# Patient Record
Sex: Male | Born: 1955 | Race: White | Hispanic: No | Marital: Married | Smoking: Current every day smoker
Health system: Southern US, Community
[De-identification: ages and names within clinical notes are randomized; demographics above are authoritative.]

## PROBLEM LIST (undated history)

## (undated) DIAGNOSIS — F32A Depression, unspecified: Secondary | ICD-10-CM

## (undated) DIAGNOSIS — D649 Anemia, unspecified: Secondary | ICD-10-CM

## (undated) DIAGNOSIS — F329 Major depressive disorder, single episode, unspecified: Secondary | ICD-10-CM

## (undated) DIAGNOSIS — F419 Anxiety disorder, unspecified: Secondary | ICD-10-CM

## (undated) DIAGNOSIS — I10 Essential (primary) hypertension: Secondary | ICD-10-CM

## (undated) DIAGNOSIS — G4730 Sleep apnea, unspecified: Secondary | ICD-10-CM

## (undated) DIAGNOSIS — R519 Headache, unspecified: Secondary | ICD-10-CM

## (undated) DIAGNOSIS — M1990 Unspecified osteoarthritis, unspecified site: Secondary | ICD-10-CM

## (undated) DIAGNOSIS — G4700 Insomnia, unspecified: Secondary | ICD-10-CM

## (undated) DIAGNOSIS — R51 Headache: Secondary | ICD-10-CM

## (undated) DIAGNOSIS — Z9289 Personal history of other medical treatment: Secondary | ICD-10-CM

## (undated) DIAGNOSIS — T7840XA Allergy, unspecified, initial encounter: Secondary | ICD-10-CM

## (undated) DIAGNOSIS — R9431 Abnormal electrocardiogram [ECG] [EKG]: Secondary | ICD-10-CM

## (undated) DIAGNOSIS — R42 Dizziness and giddiness: Secondary | ICD-10-CM

## (undated) HISTORY — PX: HERNIA REPAIR: SHX51

## (undated) HISTORY — PX: KNEE ARTHROSCOPY: SUR90

## (undated) HISTORY — PX: KNEE SURGERY: SHX244

## (undated) HISTORY — PX: COLONOSCOPY: SHX174

---

## 2005-10-24 ENCOUNTER — Emergency Department: Payer: Self-pay | Admitting: General Practice

## 2008-01-14 ENCOUNTER — Emergency Department: Payer: Self-pay | Admitting: Emergency Medicine

## 2009-10-08 IMAGING — CR DG LUMBAR SPINE 2-3V
1 series · 3 of 3 positions shown · non-contrast
Comparison: none

REASON FOR EXAM: Pain, lower back
COMMENTS:

[Series 1: view not recorded · 0.17mm/px · 3 of 3 slices shown]
[im 1/3]
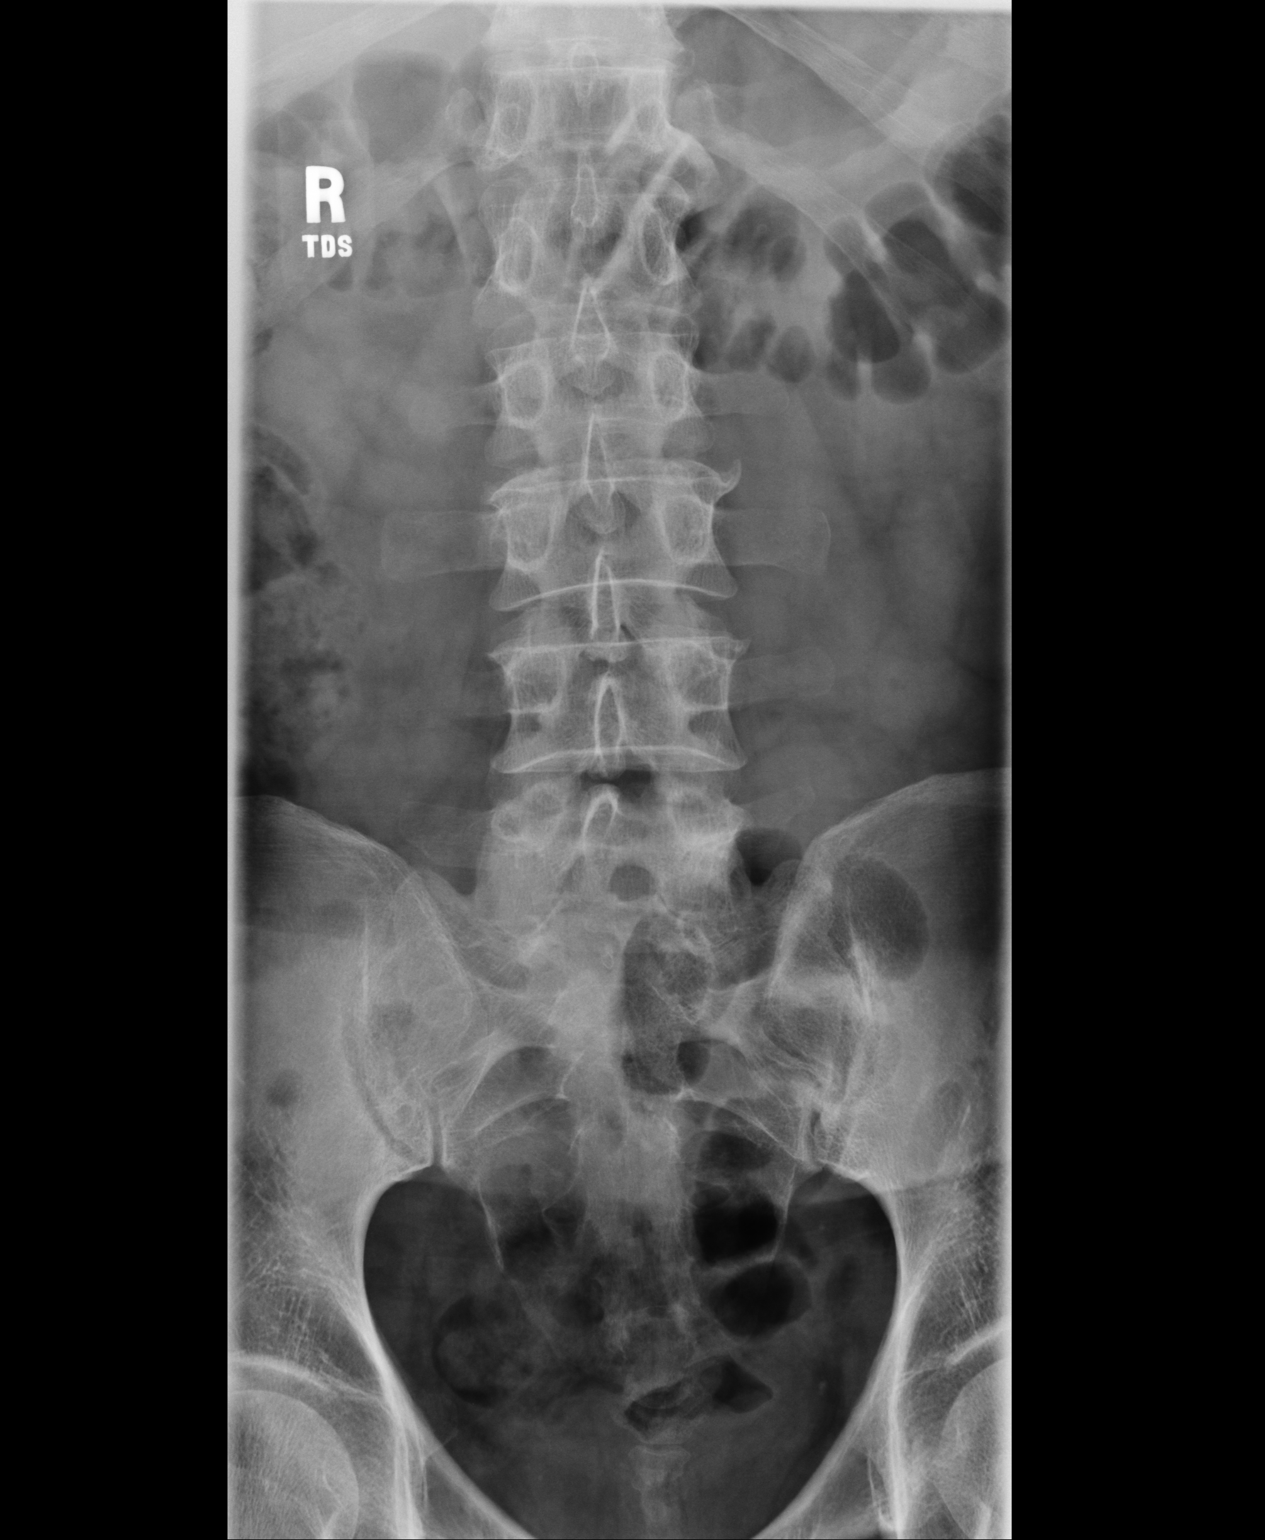
[im 2/3]
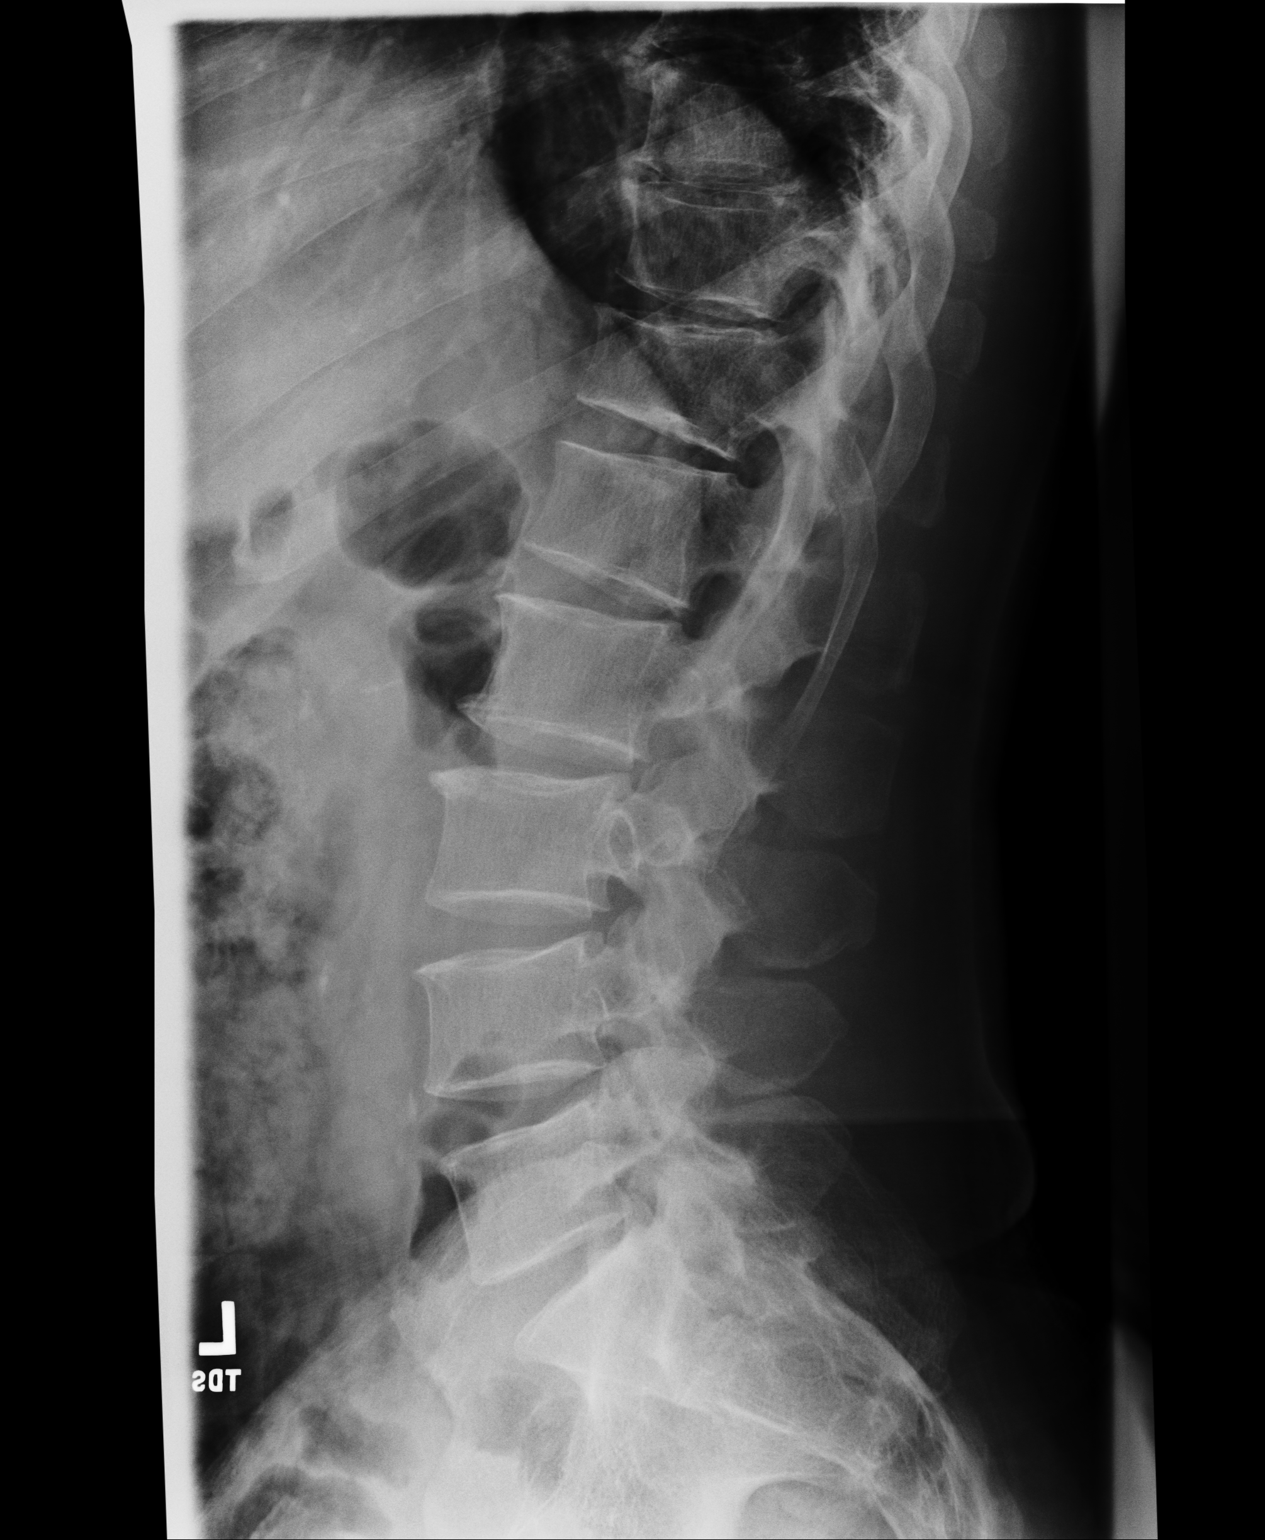
[im 3/3]
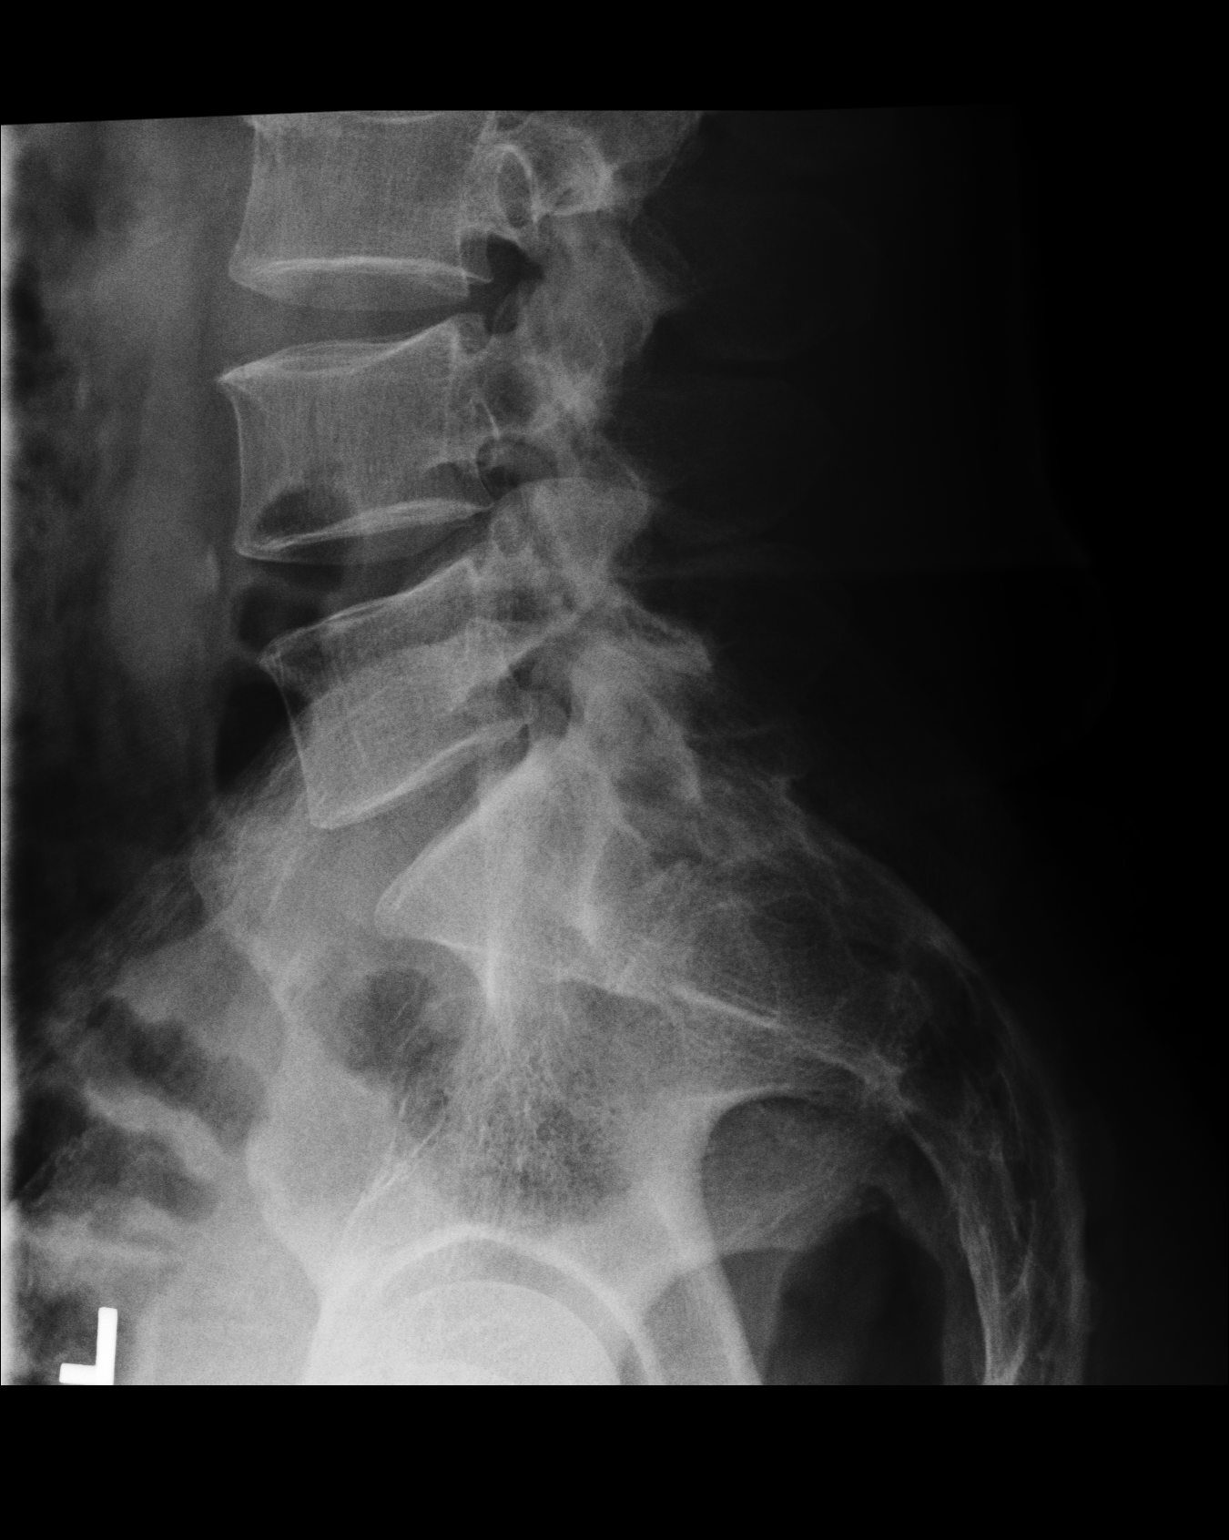

[3 of 3 positions shown; findings below may reference images not displayed]

PROCEDURE:     DXR - DXR LUMBAR SPINE AP AND LATERAL  - January 14, 2008  [DATE]

RESULT:     There is slight old appearing anterior compression deformity of
T12 and L1. No acute vertebral body fracture is seen. The vertebral body
alignment is normal. There is degenerative spurring anteriorly at multiple
levels of the lumbar spine. The pedicles are bilaterally intact.
IMPRESSION: 1.  There is slight anterior vertebral compression deformity of T12 and L1.
These appear to be old.
2.  There is slight degenerative spurring noted anteriorly at multiple
levels.

## 2011-07-13 DIAGNOSIS — F419 Anxiety disorder, unspecified: Secondary | ICD-10-CM | POA: Insufficient documentation

## 2012-02-08 DIAGNOSIS — I2510 Atherosclerotic heart disease of native coronary artery without angina pectoris: Secondary | ICD-10-CM | POA: Insufficient documentation

## 2012-02-08 DIAGNOSIS — R9431 Abnormal electrocardiogram [ECG] [EKG]: Secondary | ICD-10-CM

## 2012-02-08 HISTORY — DX: Abnormal electrocardiogram (ECG) (EKG): R94.31

## 2012-03-02 DIAGNOSIS — R079 Chest pain, unspecified: Secondary | ICD-10-CM | POA: Insufficient documentation

## 2013-01-08 DIAGNOSIS — R5383 Other fatigue: Secondary | ICD-10-CM | POA: Insufficient documentation

## 2013-06-25 ENCOUNTER — Ambulatory Visit: Payer: Self-pay | Admitting: Gastroenterology

## 2014-03-25 ENCOUNTER — Ambulatory Visit: Payer: Self-pay

## 2018-03-17 ENCOUNTER — Encounter: Payer: Self-pay | Admitting: *Deleted

## 2018-03-20 ENCOUNTER — Encounter
Admission: RE | Disposition: A | Discharge status: Home / Self Care | Payer: Self-pay | Source: Home / Self Care | Attending: Gastroenterology

## 2018-03-20 ENCOUNTER — Other Ambulatory Visit: Payer: Self-pay

## 2018-03-20 ENCOUNTER — Ambulatory Visit: Payer: 59 | Admitting: Anesthesiology

## 2018-03-20 ENCOUNTER — Ambulatory Visit
Admission: RE | Admit: 2018-03-20 | Discharge: 2018-03-20 | Disposition: A | Discharge status: Home / Self Care | Payer: 59 | Attending: Gastroenterology | Admitting: Gastroenterology

## 2018-03-20 DIAGNOSIS — K5730 Diverticulosis of large intestine without perforation or abscess without bleeding: Secondary | ICD-10-CM | POA: Insufficient documentation

## 2018-03-20 DIAGNOSIS — R197 Diarrhea, unspecified: Secondary | ICD-10-CM | POA: Diagnosis not present

## 2018-03-20 DIAGNOSIS — I10 Essential (primary) hypertension: Secondary | ICD-10-CM | POA: Diagnosis not present

## 2018-03-20 DIAGNOSIS — Z79899 Other long term (current) drug therapy: Secondary | ICD-10-CM | POA: Insufficient documentation

## 2018-03-20 DIAGNOSIS — F329 Major depressive disorder, single episode, unspecified: Secondary | ICD-10-CM | POA: Diagnosis not present

## 2018-03-20 DIAGNOSIS — Z7951 Long term (current) use of inhaled steroids: Secondary | ICD-10-CM | POA: Insufficient documentation

## 2018-03-20 DIAGNOSIS — R194 Change in bowel habit: Secondary | ICD-10-CM | POA: Insufficient documentation

## 2018-03-20 DIAGNOSIS — Z791 Long term (current) use of non-steroidal anti-inflammatories (NSAID): Secondary | ICD-10-CM | POA: Diagnosis not present

## 2018-03-20 DIAGNOSIS — F419 Anxiety disorder, unspecified: Secondary | ICD-10-CM | POA: Diagnosis not present

## 2018-03-20 HISTORY — PX: COLONOSCOPY WITH PROPOFOL: SHX5780

## 2018-03-20 HISTORY — DX: Headache, unspecified: R51.9

## 2018-03-20 HISTORY — DX: Unspecified osteoarthritis, unspecified site: M19.90

## 2018-03-20 HISTORY — DX: Insomnia, unspecified: G47.00

## 2018-03-20 HISTORY — DX: Sleep apnea, unspecified: G47.30

## 2018-03-20 HISTORY — DX: Anxiety disorder, unspecified: F41.9

## 2018-03-20 HISTORY — DX: Dizziness and giddiness: R42

## 2018-03-20 HISTORY — DX: Depression, unspecified: F32.A

## 2018-03-20 HISTORY — DX: Essential (primary) hypertension: I10

## 2018-03-20 HISTORY — DX: Abnormal electrocardiogram (ECG) (EKG): R94.31

## 2018-03-20 HISTORY — DX: Personal history of other medical treatment: Z92.89

## 2018-03-20 HISTORY — DX: Allergy, unspecified, initial encounter: T78.40XA

## 2018-03-20 HISTORY — DX: Headache: R51

## 2018-03-20 HISTORY — DX: Major depressive disorder, single episode, unspecified: F32.9

## 2018-03-20 HISTORY — DX: Anemia, unspecified: D64.9

## 2018-03-20 SURGERY — COLONOSCOPY WITH PROPOFOL
Anesthesia: General

## 2018-03-20 MED ORDER — SODIUM CHLORIDE 0.9 % IV SOLN
INTRAVENOUS | Status: DC
  Administered 2018-03-20: 11:00:00 via INTRAVENOUS

## 2018-03-20 MED ORDER — LIDOCAINE HCL (CARDIAC) PF 100 MG/5ML IV SOSY
PREFILLED_SYRINGE | INTRAVENOUS | Status: DC | PRN
  Administered 2018-03-20: 800 mg via INTRAVENOUS

## 2018-03-20 MED ORDER — PROPOFOL 10 MG/ML IV BOLUS
INTRAVENOUS | Status: AC
  Filled 2018-03-20: qty 20

## 2018-03-20 MED ORDER — PROPOFOL 10 MG/ML IV BOLUS
INTRAVENOUS | Status: AC
  Filled 2018-03-20: qty 40

## 2018-03-20 MED ORDER — PROPOFOL 500 MG/50ML IV EMUL
INTRAVENOUS | Status: DC | PRN
  Administered 2018-03-20: 100 ug/kg/min via INTRAVENOUS

## 2018-03-20 MED ORDER — PROPOFOL 10 MG/ML IV BOLUS
INTRAVENOUS | Status: DC | PRN
  Administered 2018-03-20: 70 mg via INTRAVENOUS

## 2018-03-20 NOTE — Anesthesia Postprocedure Evaluation (Signed)
Anesthesia Post Note  Patient: Daniel Odonnell  Procedure(s) Performed: COLONOSCOPY WITH PROPOFOL (N/A )  Patient location during evaluation: Endoscopy Anesthesia Type: General Level of consciousness: awake and alert and oriented Pain management: pain level controlled Vital Signs Assessment: post-procedure vital signs reviewed and stable Respiratory status: spontaneous breathing, nonlabored ventilation and respiratory function stable Cardiovascular status: blood pressure returned to baseline and stable Postop Assessment: no signs of nausea or vomiting Anesthetic complications: no     Last Vitals:  Vitals:   03/20/18 1250 03/20/18 1300  BP: 127/87 128/80  Pulse: 61 (!) 56  Resp: 14 16  Temp:    SpO2: 100% 100%    Last Pain:  Vitals:   03/20/18 1300  TempSrc:   PainSc: 0-No pain                 Chontel Warning

## 2018-03-20 NOTE — Op Note (Signed)
[facility] Gastroenterology Patient Name: Daniel StablerDavid Hilario Procedure Date: 03/20/2018 11:59 AM MRN: [id number] Account #: [id number] Date of Birth: 12-23-1955 Admit Type: Outpatient Age: [age] [facility]OM 3 Gender: Male Note Status: Finalized Procedure:            Colonoscopy Indications:          Change in bowel habits, Diarrhea Providers:            Christena DeemMartin U. Timothy Trudell, MD Referring MD:         Geraldo Pitteravid Klein, MD (Referring MD) Medicines:            Monitored Anesthesia Care Complications:        No immediate complications. Procedure:            Pre-Anesthesia Assessment:                       - ASA Grade Assessment: III - A patient with severe                        systemic disease.                       After obtaining informed consent, the colonoscope was                        passed under direct vision. Throughout the procedure,                        the patient's blood pressure, pulse, and oxygen                        saturations were monitored continuously. The                        Colonoscope was introduced through the anus and                        advanced to the the cecum, identified by appendiceal                        orifice and ileocecal valve. The colonoscopy was                        performed with moderate difficulty due to poor bowel                        prep. Successful completion of the procedure was aided                        by lavage. The patient tolerated the procedure well.                        The quality of the bowel preparation was fair. Findings:      Multiple small to medium diverticula were found in the sigmoid colon,       descending colon, transverse colon and distal transverse colon.      The exam was otherwise normal throughout the examined colon.      The retroflexed view of the distal rectum and anal verge was normal and       showed no anal or rectal  abnormalities.      Biopsies for histology were taken with a  cold forceps from the right       colon and left colon for evaluation of microscopic colitis.      The IC valve was turned open and noted to be normal, the TI was not       fully intubated. Impression:           - Preparation of the colon was fair.                       - Diverticulosis in the sigmoid colon, in the                        descending colon, in the transverse colon and in the                        distal transverse colon.                       - The distal rectum and anal verge are normal on                        retroflexion view.                       - Biopsies were taken with a cold forceps from the                        right colon and left colon for evaluation of                        microscopic colitis. Recommendation:       - Discharge patient to home.                       - Await pathology results.                       - Return to GI clinic in 2 weeks. Procedure Code(s):    --- Professional ---                       [phone] [id number], Colonoscopy, flexible; with biopsy, single or                        multiple Diagnosis Code(s):    --- Professional ---                       R19.4, Change in bowel habit                       R19.7, Diarrhea, unspecified                       K57.30, Diverticulosis of large intestine without                        perforation or abscess without bleeding CPT copyright 2018 [employer]. All rights reserved. The codes documented in this report are preliminary and upon coder review may  be revised to meet current compliance requirements. Christena DeemMartin U Devarion Mcclanahan, MD 03/20/2018 12:42:48 PM This report has been signed  electronically. Number of Addenda: 0 Note Initiated On: 03/20/2018 11:59 AM Scope Withdrawal Time: 0 hours 19 minutes 4 seconds  Total Procedure Duration: 0 hours 32 minutes 20 seconds       [facility]

## 2018-03-20 NOTE — Anesthesia Preprocedure Evaluation (Signed)
Anesthesia Evaluation  Patient identified by MRN, date of birth, ID band Patient awake    Reviewed: Allergy & Precautions, H&P , NPO status , Patient's Chart, lab work & pertinent test results, reviewed documented beta blocker date and time   History of Anesthesia Complications Negative for: history of anesthetic complications  Airway Mallampati: I  TM Distance: >3 FB Neck ROM: full    Dental  (+) Dental Advidsory Given, Missing, Teeth Intact   Pulmonary neg shortness of breath, sleep apnea and Continuous Positive Airway Pressure Ventilation , neg COPD, neg recent URI, Current Smoker,           Cardiovascular Exercise Tolerance: Good hypertension, (-) angina(-) CAD, (-) Past MI, (-) Cardiac Stents and (-) CABG negative cardio ROS  (-) dysrhythmias (-) Valvular Problems/Murmurs     Neuro/Psych PSYCHIATRIC DISORDERS Anxiety Depression negative neurological ROS     GI/Hepatic negative GI ROS, Neg liver ROS,   Endo/Other  negative endocrine ROS  Renal/GU negative Renal ROS  negative genitourinary   Musculoskeletal   Abdominal   Peds  Hematology  (+) Blood dyscrasia, anemia ,   Anesthesia Other Findings Past Medical History: 02/08/2012: Abnormal ECG No date: Abnormal EKG No date: Allergy No date: Anxiety No date: Arthritis No date: Depression No date: Dizziness No date: Headache No date: History of myocardial perfusion scan No date: Hypertension No date: Insomnia No date: Normocytic anemia No date: Sleep apnea   Reproductive/Obstetrics negative OB ROS                             Anesthesia Physical Anesthesia Plan  ASA: II  Anesthesia Plan: General   Post-op Pain Management:    Induction: Intravenous  PONV Risk Score and Plan: 1 and Propofol infusion and TIVA  Airway Management Planned: Natural Airway and Nasal Cannula  Additional Equipment:   Intra-op Plan:    Post-operative Plan:   Informed Consent: I have reviewed the patients History and Physical, chart, labs and discussed the procedure including the risks, benefits and alternatives for the proposed anesthesia with the patient or authorized representative who has indicated his/her understanding and acceptance.     Dental Advisory Given  Plan Discussed with: Anesthesiologist, CRNA and Surgeon  Anesthesia Plan Comments:         Anesthesia Quick Evaluation

## 2018-03-20 NOTE — Anesthesia Post-op Follow-up Note (Signed)
Anesthesia QCDR form completed.        

## 2018-03-20 NOTE — Transfer of Care (Signed)
Immediate Anesthesia Transfer of Care Note  Patient: Daniel SitterDavid Clay Laforte  Procedure(s) Performed: COLONOSCOPY WITH PROPOFOL (N/A )  Patient Location: PACU  Anesthesia Type:General  Level of Consciousness: awake and alert   Airway & Oxygen Therapy: Patient Spontanous Breathing and Patient connected to nasal cannula oxygen  Post-op Assessment: Report given to RN and Post -op Vital signs reviewed and stable  Post vital signs: Reviewed and stable  Last Vitals:  Vitals Value Taken Time  BP 112/75 03/20/2018 12:40 PM  Temp 36.1 C 03/20/2018 12:40 PM  Pulse 67 03/20/2018 12:43 PM  Resp 20 03/20/2018 12:43 PM  SpO2 97 % 03/20/2018 12:43 PM  Vitals shown include unvalidated device data.  Last Pain:  Vitals:   03/20/18 1240  TempSrc:   PainSc: Asleep         Complications: No apparent anesthesia complications

## 2018-03-20 NOTE — H&P (Signed)
Outpatient short stay form Pre-procedure 03/20/2018 11:54 AM Christena Deem MD  Primary Physician: Dr. Jackson Latino  Reason for visit: Colonoscopy  History of present illness: Patient is a 63 year old male presenting today for colonoscopy in regards to problems with change in bowel habits and diarrhea.  There is no blood in the stool or nominal pain.  He has had stool studies for infective organisms that has been negative.  Tolerated his prep well.  He takes no aspirin or blood thinning agent with the exception of his tumeric has been held for about 5 days.  He does take it very low dose of magnesium supplement    Current Facility-Administered Medications:  .  0.9 %  sodium chloride infusion, , Intravenous, Continuous, Christena Deem, MD, Last Rate: 20 mL/hr at 03/20/18 1143  Medications Prior to Admission  Medication Sig Dispense Refill Last Dose  . buPROPion (WELLBUTRIN XL) 150 MG 24 hr tablet Take 150 mg by mouth daily.   03/19/2018 at Unknown time  . carvedilol (COREG) 12.5 MG tablet Take 12.5 mg by mouth 2 (two) times daily with a meal.   03/19/2018 at Unknown time  . celecoxib (CELEBREX) 200 MG capsule Take 200 mg by mouth daily.   03/19/2018 at Unknown time  . Cetirizine HCl (ZYRTEC PO) Take by mouth 2 (two) times daily.   03/18/2018  . Cholecalciferol 25 MCG (1000 UT) capsule Take 1,000 Units by mouth daily.   Past Month at Unknown time  . clonazePAM (KLONOPIN) 1 MG tablet Take 1 mg by mouth 2 (two) times daily as needed.    03/19/2018 at Unknown time  . diphenoxylate-atropine (LOMOTIL) 2.5-0.025 MG tablet Take by mouth 4 (four) times daily as needed for diarrhea or loose stools.     . Hyaluronic Acid (RESTYLANE IJ) Take by mouth once.    Past Month at Unknown time  . Magnesium Oxide 250 MG TABS Take 400 mg by mouth every morning.      . Turmeric, Curcuma Longa, (TURMERIC ROOT) POWD 1,500 mg by Does not apply route 2 (two) times daily.    Past Week at Unknown time  . Valerian Root 500  MG CAPS Take by mouth daily.   Past Week at Unknown time  . venlafaxine XR (EFFEXOR-XR) 150 MG 24 hr capsule Take 150 mg by mouth daily with breakfast.   03/19/2018 at Unknown time  . fluticasone (FLONASE) 50 MCG/ACT nasal spray Place into both nostrils daily.   Not Taking at Unknown time  . glycopyrrolate (ROBINUL) 1 MG tablet Take 1 mg by mouth 3 (three) times daily.   Not Taking at Unknown time  . Loratadine (CLARITIN) 10 MG CAPS Take 10 mg by mouth 2 (two) times daily.   Not Taking at Unknown time  . Omega 3 1000 MG CAPS Take by mouth daily.   Not Taking at Unknown time  . sildenafil (REVATIO) 20 MG tablet Take 20 mg by mouth as needed.   Not Taking at Unknown time  . tadalafil (CIALIS) 5 MG tablet Take 5 mg by mouth daily as needed for erectile dysfunction.   Not Taking at Unknown time     No Known Allergies   Past Medical History:  Diagnosis Date  . Abnormal ECG 02/08/2012  . Abnormal EKG   . Allergy   . Anxiety   . Arthritis   . Depression   . Dizziness   . Headache   . History of myocardial perfusion scan   . Hypertension   .  Insomnia   . Normocytic anemia   . Sleep apnea     Review of systems:      Physical Exam    Heart and lungs: Good rate and rhythm without rub or gallop, lungs are bilaterally clear.    HEENT: Normocephalic atraumatic eyes are anicteric    Other:    Pertinant exam for procedure: Soft nontender nondistended bowel sounds positive normoactive    Planned proceedures: Colonoscopy and indicated procedures. I have discussed the risks benefits and complications of procedures to include not limited to bleeding, infection, perforation and the risk of sedation and the patient wishes to proceed.    Christena DeemMartin U Tyhesha Dutson, MD Gastroenterology 03/20/2018  11:54 AM

## 2018-03-21 LAB — SURGICAL PATHOLOGY

## 2018-03-22 ENCOUNTER — Encounter: Payer: Self-pay | Admitting: Gastroenterology

## 2019-09-23 DIAGNOSIS — R7989 Other specified abnormal findings of blood chemistry: Secondary | ICD-10-CM | POA: Insufficient documentation

## 2019-09-23 DIAGNOSIS — E875 Hyperkalemia: Secondary | ICD-10-CM | POA: Insufficient documentation

## 2019-10-19 ENCOUNTER — Ambulatory Visit
Admission: EM | Admit: 2019-10-19 | Discharge: 2019-10-19 | Disposition: A | Discharge status: Home / Self Care | Payer: BC Managed Care – PPO | Attending: Family Medicine | Admitting: Family Medicine

## 2019-10-19 ENCOUNTER — Other Ambulatory Visit: Payer: Self-pay

## 2019-10-19 DIAGNOSIS — R61 Generalized hyperhidrosis: Secondary | ICD-10-CM | POA: Insufficient documentation

## 2019-10-19 LAB — GLUCOSE, CAPILLARY: Glucose-Capillary: 123 mg/dL — ABNORMAL HIGH (ref 70–99)

## 2019-10-19 NOTE — Discharge Instructions (Signed)
EKG unchanged.   Check BP daily.  Follow up with your PCP regarding your BP.  Take care  Dr. Adriana Simas

## 2019-10-19 NOTE — ED Triage Notes (Signed)
Pt states he felt shaky today and his hands were sweating. He realized he hadn't eaten and once he hate he felt better. Checked his blood pressure and it was elevated. Has had headache all week and is waiting for appointment for neurology for same.

## 2019-10-19 NOTE — ED Provider Notes (Signed)
[facility]    CSN: [id number] Arrival date & time: 10/19/19  1722      History   Chief Complaint Chief Complaint  Patient presents with  . Hypertension   HPI  64 year old male presents for evaluation of diaphoresis/shaking.  Patient reports that this morning around 10:00 he felt really sweaty.  He states that his hands were sweating he did not feel well.  He states that he was "shaking".  Patient reports that he ate some food and felt better.  He states that he is currently feeling "exhausted".  He reports an ongoing headache.  He has an appointment in the near future with neurology for headache.  Patient reports that when he was feeling poorly he took his blood pressure and his blood pressure was elevated.  BP currently elevated at 143/101.  He was advised to come in by his primary care physician for evaluation.  Denies chest pain.  No shortness of breath.  No other associated symptoms.  No other complaints.  Past Medical History:  Diagnosis Date  . Abnormal ECG 02/08/2012  . Abnormal EKG   . Allergy   . Anxiety   . Arthritis   . Depression   . Dizziness   . Headache   . History of myocardial perfusion scan   . Hypertension   . Insomnia   . Normocytic anemia   . Sleep apnea    Past Surgical History:  Procedure Laterality Date  . COLONOSCOPY    . COLONOSCOPY WITH PROPOFOL N/A 03/20/2018   Procedure: COLONOSCOPY WITH PROPOFOL;  Surgeon: Christena Deem, MD;  Location: [facility] ENDOSCOPY;  Service: Endoscopy;  Laterality: N/A;  . HERNIA REPAIR    . KNEE ARTHROSCOPY    . KNEE SURGERY     Home Medications    Prior to Admission medications   Medication Sig Start Date End Date Taking? Authorizing Provider  buPROPion (WELLBUTRIN XL) 150 MG 24 hr tablet Take 150 mg by mouth daily.    [provider]  carvedilol (COREG) 12.5 MG tablet Take 12.5 mg by mouth 2 (two) times daily with a meal.    [provider]  celecoxib (CELEBREX) 200 MG capsule  Take 200 mg by mouth daily.    [provider]  Cetirizine HCl (ZYRTEC PO) Take by mouth 2 (two) times daily.    [provider]  Cholecalciferol 25 MCG (1000 UT) capsule Take 1,000 Units by mouth daily.    [provider]  clonazePAM (KLONOPIN) 1 MG tablet Take 1 mg by mouth 2 (two) times daily as needed.     [provider]  diphenoxylate-atropine (LOMOTIL) 2.5-0.025 MG tablet Take by mouth 4 (four) times daily as needed for diarrhea or loose stools.    [provider]  glycopyrrolate (ROBINUL) 1 MG tablet Take 1 mg by mouth 3 (three) times daily.    [provider]  Hyaluronic Acid (RESTYLANE IJ) Take by mouth once.     [provider]  Loratadine (CLARITIN) 10 MG CAPS Take 10 mg by mouth 2 (two) times daily.    [provider]  Magnesium Oxide 250 MG TABS Take 400 mg by mouth every morning.     [provider]  Omega 3 1000 MG CAPS Take by mouth daily.    [provider]  sildenafil (REVATIO) 20 MG tablet Take 20 mg by mouth as needed.    [provider]  Turmeric, Curcuma Longa, (TURMERIC ROOT) POWD 1,500 mg by Does not  apply route 2 (two) times daily.     [provider]  Valerian Root 500 MG CAPS Take by mouth daily.    [provider]  venlafaxine XR (EFFEXOR-XR) 150 MG 24 hr capsule Take 150 mg by mouth daily with breakfast.    [provider]  fluticasone (FLONASE) 50 MCG/ACT nasal spray Place into both nostrils daily.  10/19/19  [provider]  tadalafil (CIALIS) 5 MG tablet Take 5 mg by mouth daily as needed for erectile dysfunction.  10/19/19  [provider]    Family History Family History  Problem Relation Age of Onset  . Lung cancer Mother   . Kidney cancer Mother   . Polycystic ovary syndrome Sister   . Thyroid disease Sister   . Bipolar disorder Sister   . Bipolar disorder Brother     Social History Social History   Tobacco  Use  . Smoking status: Current Every Day Smoker    Packs/day: 1.00    Years: 4.00    Pack years: 4.00  . Smokeless tobacco: Never Used  Vaping Use  . Vaping Use: Never used  Substance Use Topics  . Alcohol use: Not Currently  . Drug use: Never     Allergies   Patient has no known allergies.   Review of Systems Review of Systems Per HPI  Physical Exam Triage Vital Signs ED Triage Vitals  Enc Vitals Group     BP 10/19/19 1807 (!) 143/101     Pulse Rate 10/19/19 1807 73     Resp 10/19/19 1807 19     Temp 10/19/19 1807 98.3 F (36.8 C)     Temp Source 10/19/19 1807 Oral     SpO2 10/19/19 1807 97 %     Weight 10/19/19 1808 239 lb (108.4 kg)     Height 10/19/19 1808 6\' 1"  (1.854 m)     Head Circumference --      Peak Flow --      Pain Score 10/19/19 1805 4     Pain Loc --      Pain Edu? --      Excl. in GC? --    Updated Vital Signs BP (!) 143/101 (BP Location: Left Arm)   Pulse 73   Temp 98.3 F (36.8 C) (Oral)   Resp 19   Ht 6\' 1"  (1.854 m)   Wt 108.4 kg   SpO2 97%   BMI 31.53 kg/m   Visual Acuity Right Eye Distance:   Left Eye Distance:   Bilateral Distance:    Right Eye Near:   Left Eye Near:    Bilateral Near:     Physical Exam Constitutional:      General: He is not in acute distress.    Appearance: Normal appearance. He is not ill-appearing.  HENT:     Head: Normocephalic and atraumatic.  Eyes:     General:        Right eye: No discharge.        Left eye: No discharge.     Conjunctiva/sclera: Conjunctivae normal.  Cardiovascular:     Rate and Rhythm: Normal rate and regular rhythm.     Heart sounds: No murmur heard.   Pulmonary:     Effort: Pulmonary effort is normal.     Breath sounds: Normal breath sounds. No wheezing, rhonchi or rales.  Abdominal:     General: There is no distension.     Palpations: Abdomen is soft.     Tenderness:  There is no abdominal tenderness.  Neurological:     Mental Status: He is alert.  Psychiatric:         Mood and Affect: Mood normal.        Behavior: Behavior normal.    UC Treatments / Results  Labs (all labs ordered are listed, but only abnormal results are displayed) Labs Reviewed  GLUCOSE, CAPILLARY - Abnormal; Notable for the following components:      Result Value   Glucose-Capillary 123 (*)    All other components within normal limits    EKG Interpretation: Normal sinus rhythm with rate of 67.  Left axis deviation.  Suspected LVH.  Q waves noted in the inferior leads.  No ST or T wave changes.  Per the electronic medical record this seems unchanged from his priors.  Radiology No results found.  Procedures Procedures (including critical care time)  Medications Ordered in UC Medications - No data to display  Initial Impression / Assessment and Plan / UC Course  I have reviewed the triage vital signs and the nursing notes.  Pertinent labs & imaging results that were available during my care of the patient were reviewed by me and considered in my medical decision making (see chart for details).    64 year old male presents with diaphoresis and "shaking".  He is well-appearing at this time.  Possible hypoglycemia.  EKG with no evidence of ischemia.  BP mildly elevated today.  Advised close follow-up with his primary care physician.  Supportive care.  Final Clinical Impressions(s) / UC Diagnoses   Final diagnoses:  Diaphoresis     Discharge Instructions     EKG unchanged.   Check BP daily.  Follow up with your PCP regarding your BP.  Take care  Dr. Adriana Simas    ED Prescriptions    None     PDMP not reviewed this encounter.   Tommie Sams, [place] 10/19/19 1924

## 2019-11-15 DIAGNOSIS — N529 Male erectile dysfunction, unspecified: Secondary | ICD-10-CM | POA: Insufficient documentation

## 2019-11-19 ENCOUNTER — Ambulatory Visit (INDEPENDENT_AMBULATORY_CARE_PROVIDER_SITE_OTHER): Payer: PRIVATE HEALTH INSURANCE | Admitting: Internal Medicine

## 2019-11-19 DIAGNOSIS — Z9989 Dependence on other enabling machines and devices: Secondary | ICD-10-CM

## 2019-11-19 DIAGNOSIS — Z7189 Other specified counseling: Secondary | ICD-10-CM | POA: Insufficient documentation

## 2019-11-19 DIAGNOSIS — G4719 Other hypersomnia: Secondary | ICD-10-CM

## 2019-11-19 DIAGNOSIS — G4733 Obstructive sleep apnea (adult) (pediatric): Secondary | ICD-10-CM | POA: Insufficient documentation

## 2019-11-19 NOTE — Patient Instructions (Signed)

## 2019-11-19 NOTE — Progress Notes (Signed)
[facility] [address] [place], [place] [address]  Pulmonary Sleep Medicine   Office Visit Note  Patient Name: Daniel Odonnell DOB: [birth year] MRN [id number]    Chief Complaint: Obstructive Sleep Apnea visit  Brief History:  Daniel Odonnell is seen today for follow up The patient has a 14 year history of sleep apnea. Patient is using PAP nightly. He reports hypersomnia going back several months. He has had a full workup.  The patient feels still sleepy after sleeping with PAP.  He is sleeping from 10 p.m to 9 a.m and will take a nap.  The patient reports some from PAP use. He does not wake at night. Reported sleepiness is  worse and the Epworth Sleepiness Score is 23 out of 24. The patient does not take naps for up to 3 hours. The patient complains of the following: mask leak.  The compliance download showsexcellent  compliance with an average use time of 9.5 hours. The AHI is 4.1 and this is from recent leak.  The patient does not complain of limb movements disrupting sleep. His anti-depressants have been changed but he admits to ongoing depression.  ROS  General: (-) fever, (-) chills, (-) night sweat Nose and Sinuses: (-) nasal stuffiness or itchiness, (-) postnasal drip, (-) nosebleeds, (-) sinus trouble. Mouth and Throat: (-) sore throat, (-) hoarseness. Neck: (-) swollen glands, (-) enlarged thyroid, (-) neck pain. Respiratory: - cough, - shortness of breath, - wheezing. Neurologic: - numbness, - tingling. Psychiatric: - anxiety, - depression   Current Medication: Outpatient Encounter Medications as of 11/19/2019  Medication Sig  . buPROPion (WELLBUTRIN XL) 150 MG 24 hr tablet Take 150 mg by mouth daily.  . carvedilol (COREG) 12.5 MG tablet Take 12.5 mg by mouth 2 (two) times daily with a meal.  . celecoxib (CELEBREX) 200 MG capsule Take 200 mg by mouth daily.  . Cetirizine HCl (ZYRTEC PO) Take by mouth 2 (two) times daily.  . Cholecalciferol 25 MCG (1000 UT) capsule  Take 1,000 Units by mouth daily.  . clonazePAM (KLONOPIN) 1 MG tablet Take 1 mg by mouth 2 (two) times daily as needed.   . diphenoxylate-atropine (LOMOTIL) 2.5-0.025 MG tablet Take by mouth 4 (four) times daily as needed for diarrhea or loose stools.  Marland Kitchen glycopyrrolate (ROBINUL) 1 MG tablet Take 1 mg by mouth 3 (three) times daily.  Marland Kitchen Hyaluronic Acid (RESTYLANE IJ) Take by mouth once.   . Loratadine (CLARITIN) 10 MG CAPS Take 10 mg by mouth 2 (two) times daily.  . Magnesium Oxide 250 MG TABS Take 400 mg by mouth every morning.   . Omega 3 1000 MG CAPS Take by mouth daily.  . sildenafil (REVATIO) 20 MG tablet Take 20 mg by mouth as needed.  . Turmeric, Curcuma Longa, (TURMERIC ROOT) POWD 1,500 mg by Does not apply route 2 (two) times daily.   Gerarda Fraction Root 500 MG CAPS Take by mouth daily.  Marland Kitchen venlafaxine XR (EFFEXOR-XR) 150 MG 24 hr capsule Take 150 mg by mouth daily with breakfast.  . [DISCONTINUED] fluticasone (FLONASE) 50 MCG/ACT nasal spray Place into both nostrils daily.  . [DISCONTINUED] tadalafil (CIALIS) 5 MG tablet Take 5 mg by mouth daily as needed for erectile dysfunction.   No facility-administered encounter medications on file as of 11/19/2019.    Surgical History: Past Surgical History:  Procedure Laterality Date  . COLONOSCOPY    . COLONOSCOPY WITH PROPOFOL N/A 03/20/2018   Procedure: COLONOSCOPY WITH PROPOFOL;  Surgeon: Barnetta Chapel  U, MD;  Location: [facility] ENDOSCOPY;  Service: Endoscopy;  Laterality: N/A;  . HERNIA REPAIR    . KNEE ARTHROSCOPY    . KNEE SURGERY      Medical History: Past Medical History:  Diagnosis Date  . Abnormal ECG 02/08/2012  . Abnormal EKG   . Allergy   . Anxiety   . Arthritis   . Depression   . Dizziness   . Headache   . History of myocardial perfusion scan   . Hypertension   . Insomnia   . Normocytic anemia   . Sleep apnea     Family History: Non contributory to the present illness  Social History: Social History    Socioeconomic History  . Marital status: Married    Spouse name: Not on file  . Number of children: Not on file  . Years of education: Not on file  . Highest education level: Not on file  Occupational History  . Not on file  Tobacco Use  . Smoking status: Current Every Day Smoker    Packs/day: 1.00    Years: 4.00    Pack years: 4.00  . Smokeless tobacco: Never Used  Vaping Use  . Vaping Use: Never used  Substance and Sexual Activity  . Alcohol use: Not Currently  . Drug use: Never  . Sexual activity: Not Currently  Other Topics Concern  . Not on file  Social History Narrative  . Not on file   Social Determinants of Health   Financial Resource Strain:   . Difficulty of Paying Living Expenses: Not on file  Food Insecurity:   . Worried About Programme researcher, broadcasting/film/video in the Last Year: Not on file  . Ran Out of Food in the Last Year: Not on file  Transportation Needs:   . Lack of Transportation (Medical): Not on file  . Lack of Transportation (Non-Medical): Not on file  Physical Activity:   . Days of Exercise per Week: Not on file  . Minutes of Exercise per Session: Not on file  Stress:   . Feeling of Stress : Not on file  Social Connections:   . Frequency of Communication with Friends and Family: Not on file  . Frequency of Social Gatherings with Friends and Family: Not on file  . Attends Religious Services: Not on file  . Active Member of Clubs or Organizations: Not on file  . Attends Banker Meetings: Not on file  . Marital Status: Not on file  Intimate Partner Violence:   . Fear of Current or Ex-Partner: Not on file  . Emotionally Abused: Not on file  . Physically Abused: Not on file  . Sexually Abused: Not on file    Vital Signs: Blood pressure (!) 139/91, pulse 61, height 6\' 1"  (1.854 m), weight 238 lb (108 kg), SpO2 97 %.  Examination: General Appearance: The patient is well-developed, well-nourished, and in no distress. Neck Circumference:  46 Skin: Gross inspection of skin unremarkable. Head: normocephalic, no gross deformities. Eyes: no gross deformities noted. ENT: ears appear grossly normal Neurologic: Alert and oriented. No involuntary movements.    EPWORTH SLEEPINESS SCALE:  Scale:  (0)= no chance of dozing; (1)= slight chance of dozing; (2)= moderate chance of dozing; (3)= high chance of dozing  Chance  Situtation    Sitting and reading: 3    Watching TV: 3    Sitting Inactive in public: 3    As a passenger in car: 3      Lying  down to rest: 3    Sitting and talking: 3    Sitting quielty after lunch: 3    In a car, stopped in traffic: 2   TOTAL SCORE:   23 out of 24    SLEEP STUDIES:  1. PSG 04/02/05 AHI 56 SpO69min 88%   CPAP COMPLIANCE DATA:  Date Range: 08/17/19-11/14/19  Average Daily Use: 9.5 hours  Median Use: 9.7  Compliance for > 4 Hours: 97%  AHI: 4.1 respiratory events per hour  Days Used: 89/90  Mask Leak: 37.5  95th Percentile Pressure: 12.9  LABS: Recent Results (from the past 2160 hour(s))  Glucose, capillary     Status: Abnormal   Collection Time: 10/19/19  6:07 PM  Result Value Ref Range   Glucose-Capillary 123 (H) 70 - 99 mg/dL    Comment: Glucose reference range applies only to samples taken after fasting for at least 8 hours.    Radiology: No results found.   Assessment and Plan: Patient Active Problem List   Diagnosis Date Noted  . [name] on CPAP 11/19/2019  . CPAP use counseling 11/19/2019      The patient does tolerate PAP and reports less benefit from PAP use. He is sleeping longer and napping but never feels refreshed. He admits to depression which is not fully controlled. He denies any precipitating factors to the sleepiness. The apnea is elevated recently but this occurred after the sleepiness and is related to recent mask leak. The patient was  advised to follow up with his therapist and psychiatrist. He became teary in the office over his past.  He is to replace his supplies more often to prevent mask leak. The compliance is excellent. The AHI is within accepted levels..   1. [name]- continue excellent compliance. Replace supplies to prevent mask leak and and subsequent respiratory events. 2.  Hypersomnia- likely due to deprssion. 3.  CPAP couseling-Discussed importance of adequate CPAP use as well as       proper care and cleaning techniques of machine and all supplies 4. Morbid obesity diet exercise management. 5. Elevated blood pressure was noted to work on weight loss through diet and exercise.  General Counseling: I have discussed the findings of the evaluation and examination with Onalee Hua.  I have also discussed any further diagnostic evaluation thatmay be needed or ordered today. Onalee Hua verbalizes understanding of the findings of todays visit. We also reviewed his medications today and discussed drug interactions and side effects including but not limited excessive drowsiness and altered mental states. We also discussed that there is always a risk not just to him but also people around him. he has been encouraged to call the office with any questions or concerns that should arise related to todays visit.  I have personally obtained a history, examined the patient, evaluated laboratory and imaging results, formulated the assessment and plan and placed orders.  This patient was seen by Leeanne Deed AGNP-C in Collaboration with Dr. Freda Munro as a part of collaborative care agreement.  Valentino Hue Sol Blazing, PhD, FAASM  Diplomate, American Board of Sleep Medicine    Yevonne Pax, MD [facility] Diplomate ABMS Pulmonary and Critical Care Medicine Sleep medicine

## 2020-11-14 ENCOUNTER — Ambulatory Visit: Payer: PRIVATE HEALTH INSURANCE

## 2020-11-17 ENCOUNTER — Ambulatory Visit: Payer: PRIVATE HEALTH INSURANCE

## 2020-11-17 ENCOUNTER — Other Ambulatory Visit: Payer: Self-pay

## 2020-11-17 NOTE — Progress Notes (Unsigned)
[facility] [address] [place], [place] [address]  Pulmonary Sleep Medicine   Office Visit Note  Patient Name: Daniel Odonnell DOB: 06-May-1955 MRN [id number]    Chief Complaint: Obstructive Sleep Apnea visit  Brief History:  [place] is seen today for one year follow up.  The patient has a 15 year history of sleep apnea. Patient is using PAP nightly @ 15cmH2O, pressure changed on 04/16/20 from APAP 7 - 15cmH2O.  Last year, the patient stated he still felt  sleepy after sleeping with PAP.  But since his pressure change from APAP to fixed CPAP, the patient reports *** from PAP use. Reported sleepiness is  *** and the Epworth Sleepiness Score is *** out of 24. The patient *** take naps. The patient complains of the following: ***  The compliance download shows  compliance with an average use time of 10:30 hours @ 100%. The AHI is 0.6  The patient *** of limb movements disrupting sleep.  ROS  General: (-) fever, (-) chills, (-) night sweat Nose and Sinuses: (-) nasal stuffiness or itchiness, (-) postnasal drip, (-) nosebleeds, (-) sinus trouble. Mouth and Throat: (-) sore throat, (-) hoarseness. Neck: (-) swollen glands, (-) enlarged thyroid, (-) neck pain. Respiratory: *** cough, *** shortness of breath, *** wheezing. Neurologic: *** numbness, *** tingling. Psychiatric: *** anxiety, *** depression   Current Medication: Outpatient Encounter Medications as of 11/17/2020  Medication Sig   buPROPion (WELLBUTRIN XL) 150 MG 24 hr tablet Take 150 mg by mouth daily.   carvedilol (COREG) 12.5 MG tablet Take 12.5 mg by mouth 2 (two) times daily with a meal.   celecoxib (CELEBREX) 200 MG capsule Take 200 mg by mouth daily.   Cetirizine HCl (ZYRTEC PO) Take by mouth 2 (two) times daily.   Cholecalciferol 25 MCG (1000 UT) capsule Take 1,000 Units by mouth daily.   clonazePAM (KLONOPIN) 1 MG tablet Take 1 mg by mouth 2 (two) times daily as needed.    diphenoxylate-atropine (LOMOTIL)  2.5-0.025 MG tablet Take by mouth 4 (four) times daily as needed for diarrhea or loose stools.   glycopyrrolate (ROBINUL) 1 MG tablet Take 1 mg by mouth 3 (three) times daily.   Hyaluronic Acid (RESTYLANE IJ) Take by mouth once.    Loratadine (CLARITIN) 10 MG CAPS Take 10 mg by mouth 2 (two) times daily.   Magnesium Oxide 250 MG TABS Take 400 mg by mouth every morning.    Omega 3 1000 MG CAPS Take by mouth daily.   sildenafil (REVATIO) 20 MG tablet Take 20 mg by mouth as needed.   Turmeric, Curcuma Longa, (TURMERIC ROOT) POWD 1,500 mg by Does not apply route 2 (two) times daily.    Valerian Root 500 MG CAPS Take by mouth daily.   venlafaxine XR (EFFEXOR-XR) 150 MG 24 hr capsule Take 150 mg by mouth daily with breakfast.   [DISCONTINUED] fluticasone (FLONASE) 50 MCG/ACT nasal spray Place into both nostrils daily.   [DISCONTINUED] tadalafil (CIALIS) 5 MG tablet Take 5 mg by mouth daily as needed for erectile dysfunction.   No facility-administered encounter medications on file as of 11/17/2020.    Surgical History: Past Surgical History:  Procedure Laterality Date   COLONOSCOPY     COLONOSCOPY WITH PROPOFOL N/A 03/20/2018   Procedure: COLONOSCOPY WITH PROPOFOL;  Surgeon: Christena Deem, MD;  Location: [facility] ENDOSCOPY;  Service: Endoscopy;  Laterality: N/A;   HERNIA REPAIR     KNEE ARTHROSCOPY     KNEE SURGERY  Medical History: Past Medical History:  Diagnosis Date   Abnormal ECG 02/08/2012   Abnormal EKG    Allergy    Anxiety    Arthritis    Depression    Dizziness    Headache    History of myocardial perfusion scan    Hypertension    Insomnia    Normocytic anemia    Sleep apnea     Family History: Non contributory to the present illness  Social History: Social History   Socioeconomic History   Marital status: Married    Spouse name: Not on file   Number of children: Not on file   Years of education: Not on file   Highest education level: Not on file   Occupational History   Not on file  Tobacco Use   Smoking status: Every Day    Packs/day: 1.00    Years: 4.00    Pack years: 4.00    Types: Cigarettes   Smokeless tobacco: Never  Vaping Use   Vaping Use: Never used  Substance and Sexual Activity   Alcohol use: Not Currently   Drug use: Never   Sexual activity: Not Currently  Other Topics Concern   Not on file  Social History Narrative   Not on file   Social Determinants of Health   Financial Resource Strain: Not on file  Food Insecurity: Not on file  Transportation Needs: Not on file  Physical Activity: Not on file  Stress: Not on file  Social Connections: Not on file  Intimate Partner Violence: Not on file    Vital Signs: There were no vitals taken for this visit.  Examination: General Appearance: The patient is well-developed, well-nourished, and in no distress. Neck Circumference: 46cm Skin: Gross inspection of skin unremarkable. Head: normocephalic, no gross deformities. Eyes: no gross deformities noted. ENT: ears appear grossly normal Neurologic: Alert and oriented. No involuntary movements.    EPWORTH SLEEPINESS SCALE:  Scale:  (0)= no chance of dozing; (1)= slight chance of dozing; (2)= moderate chance of dozing; (3)= high chance of dozing  Chance  Situtation    Sitting and reading: ***    Watching TV: ***    Sitting Inactive in public: ***    As a passenger in car: ***      Lying down to rest: ***    Sitting and talking: ***    Sitting quielty after lunch: ***    In a car, stopped in traffic: ***   TOTAL SCORE:   *** out of 24    SLEEP STUDIES:  PSG 04/02/05 AHI 56 SpO31min 88%   CPAP COMPLIANCE DATA:  Date Range: 04/17/20 - 11/12/20  (04/16/20-press.changed to 15 cmH2O-no orders in chart)  Average Daily Use: 10:30 hours  Median Use: 10:25 hours  Compliance for > 4 Hours: 100%  AHI: 0.6 respiratory events per hour  Days Used: 210/210  Mask Leak: 60.6 lpm  95th  Percentile Pressure: 15 cmH2O         LABS: No results found for this or any previous visit (from the past 2160 hour(s)).  Radiology: No results found.  No results found.  No results found.    Assessment and Plan: Patient Active Problem List   Diagnosis Date Noted   [name] on CPAP 11/19/2019   CPAP use counseling 11/19/2019      The patient *** tolerate PAP and reports *** benefit from PAP use. The patient was reminded how to *** and advised to ***. The patient was also  counselled on ***. The compliance is ***. The AHI is ***.   ***  General Counseling: I have discussed the findings of the evaluation and examination with Onalee Hua.  I have also discussed any further diagnostic evaluation thatmay be needed or ordered today. Onalee Hua verbalizes understanding of the findings of todays visit. We also reviewed his medications today and discussed drug interactions and side effects including but not limited excessive drowsiness and altered mental states. We also discussed that there is always a risk not just to him but also people around him. he has been encouraged to call the office with any questions or concerns that should arise related to todays visit.  No orders of the defined types were placed in this encounter.       I have personally obtained a history, examined the patient, evaluated laboratory and imaging results, formulated the assessment and plan and placed orders.   Valentino Hue Sol Blazing, PhD, FAASM  Diplomate, American Board of Sleep Medicine    Yevonne Pax, MD [facility] Diplomate ABMS Pulmonary and Critical Care Medicine Sleep medicine

## 2021-10-20 DIAGNOSIS — E782 Mixed hyperlipidemia: Secondary | ICD-10-CM | POA: Insufficient documentation

## 2022-03-29 ENCOUNTER — Ambulatory Visit (INDEPENDENT_AMBULATORY_CARE_PROVIDER_SITE_OTHER): Payer: Medicare Other | Admitting: Internal Medicine

## 2022-03-29 VITALS — BP 137/73 | HR 71 | Resp 16 | Ht 73.0 in | Wt 224.7 lb

## 2022-03-29 DIAGNOSIS — Z7189 Other specified counseling: Secondary | ICD-10-CM

## 2022-03-29 DIAGNOSIS — Z8719 Personal history of other diseases of the digestive system: Secondary | ICD-10-CM | POA: Insufficient documentation

## 2022-03-29 DIAGNOSIS — K219 Gastro-esophageal reflux disease without esophagitis: Secondary | ICD-10-CM | POA: Insufficient documentation

## 2022-03-29 DIAGNOSIS — G4733 Obstructive sleep apnea (adult) (pediatric): Secondary | ICD-10-CM

## 2022-03-29 DIAGNOSIS — Z9889 Other specified postprocedural states: Secondary | ICD-10-CM | POA: Insufficient documentation

## 2022-03-29 DIAGNOSIS — S63519A Sprain of carpal joint of unspecified wrist, initial encounter: Secondary | ICD-10-CM | POA: Insufficient documentation

## 2022-03-29 DIAGNOSIS — F5104 Psychophysiologic insomnia: Secondary | ICD-10-CM | POA: Insufficient documentation

## 2022-03-29 DIAGNOSIS — I10 Essential (primary) hypertension: Secondary | ICD-10-CM | POA: Insufficient documentation

## 2022-03-29 DIAGNOSIS — F32A Depression, unspecified: Secondary | ICD-10-CM | POA: Insufficient documentation

## 2022-03-29 NOTE — Patient Instructions (Signed)

## 2022-03-29 NOTE — Progress Notes (Unsigned)
[facility] [place], [place] [address]  Pulmonary Sleep Medicine   Office Visit Note  Patient Name: Daniel Odonnell DOB: May 14, 1955 MRN [id number]    Chief Complaint: Obstructive Sleep Apnea visit  Brief History:  Daniel Odonnell is seen today for an annual follow up on CPAP@16cmh20$ .  The patient has a 16 year history of sleep apnea. Patient is using PAP nightly.  The patient feels tired after sleeping with PAP. Patient states that despite using his machine nightly he isn't feeling as rested as he used to. This has been going on the last few years. The patient reports some benefit from PAP use. Reported sleepiness is  somewhat improved and the Epworth Sleepiness Score is 12 out of 24. The patient does take 2-3 hr naps daily with his CPAP on. The patient complains of the following: waking tired and EDS.  The compliance download shows 100% compliance with an average use time of 11 hours. The AHI is 3.0  The patient does not complain of limb movements disrupting sleep.  ROS  General: (-) fever, (-) chills, (-) night sweat Nose and Sinuses: (-) nasal stuffiness or itchiness, (-) postnasal drip, (-) nosebleeds, (-) sinus trouble. Mouth and Throat: (-) sore throat, (-) hoarseness. Neck: (-) swollen glands, (-) enlarged thyroid, (-) neck pain. Respiratory: + cough, - shortness of breath, - wheezing. Neurologic: - numbness, - tingling. Psychiatric: + anxiety, + depression   Current Medication: Outpatient Encounter Medications as of 03/29/2022  Medication Sig   FLUoxetine (PROZAC) 40 MG capsule Take 80 mg by mouth daily.   hydrochlorothiazide (MICROZIDE) 12.5 MG capsule Take 1 capsule by mouth daily.   rosuvastatin (CRESTOR) 10 MG tablet Take 1 tablet by mouth daily.   AUVELITY 45-105 MG TBCR Take 1 tablet by mouth daily.   Cetirizine HCl (ZYRTEC PO) Take by mouth 2 (two) times daily.   Cholecalciferol 125 MCG (5000 UT) TABS Take by mouth.   diphenoxylate-atropine  (LOMOTIL) 2.5-0.025 MG tablet Take by mouth 4 (four) times daily as needed for diarrhea or loose stools.   Hyaluronic Acid (RESTYLANE IJ) Take by mouth once.    Magnesium Oxide 250 MG TABS Take 400 mg by mouth every morning.    traZODone (DESYREL) 50 MG tablet Take 100 mg by mouth at bedtime as needed.   [DISCONTINUED] buPROPion (WELLBUTRIN XL) 150 MG 24 hr tablet Take 150 mg by mouth daily.   [DISCONTINUED] carvedilol (COREG) 12.5 MG tablet Take 12.5 mg by mouth 2 (two) times daily with a meal.   [DISCONTINUED] celecoxib (CELEBREX) 200 MG capsule Take 200 mg by mouth daily.   [DISCONTINUED] Cholecalciferol 25 MCG (1000 UT) capsule Take 1,000 Units by mouth daily.   [DISCONTINUED] clonazePAM (KLONOPIN) 1 MG tablet Take 1 mg by mouth 2 (two) times daily as needed.    [DISCONTINUED] fluticasone (FLONASE) 50 MCG/ACT nasal spray Place into both nostrils daily.   [DISCONTINUED] glycopyrrolate (ROBINUL) 1 MG tablet Take 1 mg by mouth 3 (three) times daily.   [DISCONTINUED] Loratadine (CLARITIN) 10 MG CAPS Take 10 mg by mouth 2 (two) times daily.   [DISCONTINUED] Omega 3 1000 MG CAPS Take by mouth daily.   [DISCONTINUED] sildenafil (REVATIO) 20 MG tablet Take 20 mg by mouth as needed.   [DISCONTINUED] tadalafil (CIALIS) 5 MG tablet Take 5 mg by mouth daily as needed for erectile dysfunction.   [DISCONTINUED] Turmeric, Curcuma Longa, (TURMERIC ROOT) POWD 1,500 mg by Does not apply route 2 (two) times daily.    [DISCONTINUED]  Valerian Root 500 MG CAPS Take by mouth daily.   [DISCONTINUED] venlafaxine XR (EFFEXOR-XR) 150 MG 24 hr capsule Take 150 mg by mouth daily with breakfast.   No facility-administered encounter medications on file as of 03/29/2022.    Surgical History: Past Surgical History:  Procedure Laterality Date   COLONOSCOPY     COLONOSCOPY WITH PROPOFOL N/A 03/20/2018   Procedure: COLONOSCOPY WITH PROPOFOL;  Surgeon: Lollie Sails, MD;  Location: [facility] ENDOSCOPY;  Service: Endoscopy;   Laterality: N/A;   HERNIA REPAIR     KNEE ARTHROSCOPY     KNEE SURGERY      Medical History: Past Medical History:  Diagnosis Date   Abnormal ECG 02/08/2012   Abnormal EKG    Allergy    Anxiety    Arthritis    Depression    Dizziness    Headache    History of myocardial perfusion scan    Hypertension    Insomnia    Normocytic anemia    Sleep apnea     Family History: Non contributory to the present illness  Social History: Social History   Socioeconomic History   Marital status: Married    Spouse name: Not on file   Number of children: Not on file   Years of education: Not on file   Highest education level: Not on file  Occupational History   Not on file  Tobacco Use   Smoking status: Every Day    Packs/day: 1.00    Years: 4.00    Total pack years: 4.00    Types: Cigarettes   Smokeless tobacco: Never  Vaping Use   Vaping Use: Never used  Substance and Sexual Activity   Alcohol use: Not Currently   Drug use: Never   Sexual activity: Not Currently  Other Topics Concern   Not on file  Social History Narrative   Not on file   Social Determinants of Health   Financial Resource Strain: Not on file  Food Insecurity: Not on file  Transportation Needs: Not on file  Physical Activity: Not on file  Stress: Not on file  Social Connections: Not on file  Intimate Partner Violence: Not on file    Vital Signs: Blood pressure 137/73, pulse 71, resp. rate 16, height 6' 1"$  (1.854 m), weight 224 lb 11.2 oz (101.9 kg), SpO2 97 %. Body mass index is 29.65 kg/m.    Examination: General Appearance: The patient is well-developed, well-nourished, and in no distress. Neck Circumference: 46cm Skin: Gross inspection of skin unremarkable. Head: normocephalic, no gross deformities. Eyes: no gross deformities noted. ENT: ears appear grossly normal Neurologic: Alert and oriented. No involuntary movements.  STOP BANG RISK ASSESSMENT S (snore) Have you been told that  you snore?     No   T (tired) Are you often tired, fatigued, or sleepy during the day?   YES  O (obstruction) Do you stop breathing, choke, or gasp during sleep? NO   P (pressure) Do you have or are you being treated for high blood pressure? NO   B (BMI) Is your body index greater than 35 kg/m? NO   A (age) Are you 42 years old or older? YES   N (neck) Do you have a neck circumference greater than 16 inches?   YES   G (gender) Are you a male? YES   TOTAL STOP/BANG "YES" ANSWERS 4       A STOP-Bang score of 2 or less is considered low risk, and a score  of 5 or more is high risk for having either moderate or severe [name]. For people who score 3 or 4, doctors may need to perform further assessment to determine how likely they are to have [name].         EPWORTH SLEEPINESS SCALE:  Scale:  (0)= no chance of dozing; (1)= slight chance of dozing; (2)= moderate chance of dozing; (3)= high chance of dozing  Chance  Situtation    Sitting and reading: 1    Watching TV: 2    Sitting Inactive in public: 1    As a passenger in car: 3      Lying down to rest: 3    Sitting and talking: 1    Sitting quielty after lunch: 1    In a car, stopped in traffic: 0   TOTAL SCORE:   12 out of 24    SLEEP STUDIES:  PSG (04/02/05)  AHI 56, SPO2 88%   CPAP COMPLIANCE DATA:  Date Range: 02/20/21-02/19/22  Average Daily Use: 11 hours 17 min  Median Use: 11 hrs 2 min  Compliance for > 4 Hours: 100% days  AHI: 3.0 respiratory events per hour  Days Used: 365/365  Mask Leak: 61.1  95th Percentile Pressure: 15         LABS: No results found for this or any previous visit (from the past 2160 hour(s)).  Radiology: No results found.  No results found.  No results found.    Assessment and Plan: Patient Active Problem List   Diagnosis Date Noted   Carpal joint sprain 03/29/2022   Chronic insomnia 03/29/2022   Depression 03/29/2022   Essential hypertension 03/29/2022    Gastroesophageal reflux disease 03/29/2022   History of hernia repair 03/29/2022   History of shoulder surgery 03/29/2022   Hyperlipidemia, mixed 10/20/2021   [name] on CPAP 11/19/2019   CPAP use counseling 11/19/2019   Erectile dysfunction 11/15/2019   Elevated serum creatinine 09/23/2019   Hyperkalemia 09/23/2019   Fatigue 01/08/2013   Chest pain 03/02/2012   Coronary artery disease involving native coronary artery of native heart without angina pectoris 02/08/2012   Anxiety 07/13/2011    1. [name] on CPAP Pt's apnea is well controlled. He still is sleeping for prolonged periods and has trouble getting out of bed but it appears this is likely related to his depression and anxiety which are not controlled and he has had recent medication changes. He follows with a psychiatrist. We discussed cleaning and care of equipment and replacing supplies routinely. He will continue with excellent compliance with pap.   2. CPAP use counseling CPAP Counseling: had a lengthy discussion with the patient regarding the importance of PAP therapy in management of the sleep apnea. Patient appears to understand the risk factor reduction and also understands the risks associated with untreated sleep apnea. Patient will try to make a good faith effort to remain compliant with therapy. Also instructed the patient on proper cleaning of the device including the water must be changed daily if possible and use of distilled water is preferred. Patient understands that the machine should be regularly cleaned with appropriate recommended cleaning solutions that do not damage the PAP machine for example given white vinegar and water rinses. Other methods such as ozone treatment may not be as good as these simple methods to achieve cleaning.       General Counseling: I have discussed the findings of the evaluation and examination with Daniel Odonnell.  I have also discussed any further  diagnostic evaluation thatmay be needed or ordered  today. Daniel Odonnell verbalizes understanding of the findings of todays visit. We also reviewed his medications today and discussed drug interactions and side effects including but not limited excessive drowsiness and altered mental states. We also discussed that there is always a risk not just to him but also people around him. he has been encouraged to call the office with any questions or concerns that should arise related to todays visit.  No orders of the defined types were placed in this encounter.       I have personally obtained a history, examined the patient, evaluated laboratory and imaging results, formulated the assessment and plan and placed orders. This patient was seen today by Tressie Ellis, PA-C in collaboration with Dr. Devona Konig.   Allyne Gee, MD [facility] Diplomate ABMS Pulmonary Critical Care Medicine and Sleep Medicine

## 2022-06-29 ENCOUNTER — Ambulatory Visit
Admission: EM | Admit: 2022-06-29 | Discharge: 2022-06-29 | Disposition: A | Discharge status: Home / Self Care | Payer: Medicare Other | Attending: Physician Assistant | Admitting: Physician Assistant

## 2022-06-29 DIAGNOSIS — S81802A Unspecified open wound, left lower leg, initial encounter: Secondary | ICD-10-CM | POA: Diagnosis not present

## 2022-06-29 DIAGNOSIS — S41102A Unspecified open wound of left upper arm, initial encounter: Secondary | ICD-10-CM | POA: Diagnosis not present

## 2022-06-29 DIAGNOSIS — Z23 Encounter for immunization: Secondary | ICD-10-CM

## 2022-06-29 DIAGNOSIS — W540XXA Bitten by dog, initial encounter: Secondary | ICD-10-CM | POA: Diagnosis not present

## 2022-06-29 MED ORDER — AMOXICILLIN-POT CLAVULANATE 875-125 MG PO TABS: 1.0000 | ORAL_TABLET | Freq: Two times a day (BID) | ORAL | 0 refills | Status: AC

## 2022-06-29 MED ORDER — TETANUS-DIPHTH-ACELL PERTUSSIS 5-2.5-18.5 LF-MCG/0.5 IM SUSY
0.5000 mL | PREFILLED_SYRINGE | Freq: Once | INTRAMUSCULAR | Status: AC
  Administered 2022-06-29: 0.5 mL via INTRAMUSCULAR

## 2022-06-29 NOTE — ED Provider Notes (Signed)
[facility]    CSN: [id number] Arrival date & time: 06/29/22  1132      History   Chief Complaint Chief Complaint  Patient presents with   Animal Bite    HPI Daniel Odonnell is a 67 y.o. male presenting for multiple dog bites to the left arm and left thigh.  Patient says he was just going for a walk today and his neighbors dog was off the leash and attacked him.  This has already been reported to animal control and animal control is picked up the pet.  Patient is unsure of the rabies vaccination status of animal.  He says he was told by animal control they will know in about 10 days.  He would like to hold off on rabies vaccination at this time await further results.  He says his last tetanus immunization was about 15 years ago.  He reports that he has cleaned the wounds.  Denies any significant pain.  No other complaints.  HPI  Past Medical History:  Diagnosis Date   Abnormal ECG 02/08/2012   Abnormal EKG    Allergy    Anxiety    Arthritis    Depression    Dizziness    Headache    History of myocardial perfusion scan    Hypertension    Insomnia    Normocytic anemia    Sleep apnea     Patient Active Problem List   Diagnosis Date Noted   Carpal joint sprain 03/29/2022   Chronic insomnia 03/29/2022   Depression 03/29/2022   Essential hypertension 03/29/2022   Gastroesophageal reflux disease 03/29/2022   History of hernia repair 03/29/2022   History of shoulder surgery 03/29/2022   Hyperlipidemia, mixed 10/20/2021   [name] on CPAP 11/19/2019   CPAP use counseling 11/19/2019   Erectile dysfunction 11/15/2019   Elevated serum creatinine 09/23/2019   Hyperkalemia 09/23/2019   Fatigue 01/08/2013   Chest pain 03/02/2012   Coronary artery disease involving native coronary artery of native heart without angina pectoris 02/08/2012   Anxiety 07/13/2011    Past Surgical History:  Procedure Laterality Date   COLONOSCOPY     COLONOSCOPY WITH PROPOFOL N/A  03/20/2018   Procedure: COLONOSCOPY WITH PROPOFOL;  Surgeon: Christena Deem, MD;  Location: [facility] ENDOSCOPY;  Service: Endoscopy;  Laterality: N/A;   HERNIA REPAIR     KNEE ARTHROSCOPY     KNEE SURGERY         Home Medications    Prior to Admission medications   Medication Sig Start Date End Date Taking? Authorizing Provider  amoxicillin-clavulanate (AUGMENTIN) 875-125 MG tablet Take 1 tablet by mouth every 12 (twelve) hours for 7 days. 06/29/22 07/06/22 Yes Eusebio Friendly B, PA-C  AUVELITY 45-105 MG TBCR Take 1 tablet by mouth daily.    [provider]  Cetirizine HCl (ZYRTEC PO) Take by mouth 2 (two) times daily.    [provider]  Cholecalciferol 125 MCG (5000 UT) TABS Take by mouth.    [provider]  diphenoxylate-atropine (LOMOTIL) 2.5-0.025 MG tablet Take by mouth 4 (four) times daily as needed for diarrhea or loose stools.    [provider]  FLUoxetine (PROZAC) 40 MG capsule Take 80 mg by mouth daily. 03/23/22   [provider]  Hyaluronic Acid (RESTYLANE IJ) Take by mouth once.     [provider]  hydrochlorothiazide (MICROZIDE) 12.5 MG capsule Take 1 capsule by mouth daily. 08/10/21   [provider]  Magnesium Oxide 250  MG TABS Take 400 mg by mouth every morning.     [provider]  rosuvastatin (CRESTOR) 10 MG tablet Take 1 tablet by mouth daily. 02/10/22   [provider]  traZODone (DESYREL) 50 MG tablet Take 100 mg by mouth at bedtime as needed.    [provider]  fluticasone (FLONASE) 50 MCG/ACT nasal spray Place into both nostrils daily.  10/19/19  [provider]  tadalafil (CIALIS) 5 MG tablet Take 5 mg by mouth daily as needed for erectile dysfunction.  10/19/19  [provider]    Family History Family History  Problem Relation Age of Onset   Lung cancer Mother    Kidney cancer Mother    Polycystic ovary syndrome Sister    Thyroid disease Sister    Bipolar  disorder Sister    Bipolar disorder Brother     Social History Social History   Tobacco Use   Smoking status: Every Day    Packs/day: 1    Types: Cigarettes   Smokeless tobacco: Never  Vaping Use   Vaping Use: Never used  Substance Use Topics   Alcohol use: Not Currently   Drug use: Never     Allergies   Patient has no known allergies.   Review of Systems Review of Systems  Musculoskeletal:  Negative for arthralgias, gait problem and joint swelling.  Skin:  Positive for wound. Negative for color change.  Neurological:  Negative for weakness and numbness.     Physical Exam Triage Vital Signs ED Triage Vitals  Enc Vitals Group     BP 06/29/22 1250 114/76     Pulse Rate 06/29/22 1250 62     Resp 06/29/22 1250 18     Temp 06/29/22 1250 97.8 F (36.6 C)     Temp Source 06/29/22 1250 Oral     SpO2 06/29/22 1250 94 %     Weight --      Height --      Head Circumference --      Peak Flow --      Pain Score 06/29/22 1251 4     Pain Loc --      Pain Edu? --      Excl. in GC? --    No data found.  Updated Vital Signs BP 114/76 (BP Location: Left Arm)   Pulse 62   Temp 97.8 F (36.6 C) (Oral)   Resp 18   SpO2 94%      Physical Exam Vitals and nursing note reviewed.  Constitutional:      General: He is not in acute distress.    Appearance: Normal appearance. He is well-developed. He is not ill-appearing.  HENT:     Head: Normocephalic and atraumatic.  Eyes:     General: No scleral icterus.    Conjunctiva/sclera: Conjunctivae normal.  Cardiovascular:     Rate and Rhythm: Normal rate and regular rhythm.     Pulses: Normal pulses.  Pulmonary:     Effort: Pulmonary effort is normal. No respiratory distress.  Musculoskeletal:     Cervical back: Neck supple.  Skin:    General: Skin is warm and dry.     Capillary Refill: Capillary refill takes less than 2 seconds.     Findings: Wound present.     Comments: Multiple abrasions, punctures and superficial  lacerations of left arm and left thigh  Neurological:     General: No focal deficit present.     Mental Status: He is alert.  Mental status is at baseline.     Motor: No weakness.     Gait: Gait normal.  Psychiatric:        Mood and Affect: Mood normal.        Behavior: Behavior normal.           UC Treatments / Results  Labs (all labs ordered are listed, but only abnormal results are displayed) Labs Reviewed - No data to display  EKG   Radiology No results found.  Procedures Procedures (including critical care time)  Medications Ordered in UC Medications  Tdap (BOOSTRIX) injection 0.5 mL (0.5 mLs Intramuscular Given 06/29/22 1324)    Initial Impression / Assessment and Plan / UC Course  I have reviewed the triage vital signs and the nursing notes.  Pertinent labs & imaging results that were available during my care of the patient were reviewed by me and considered in my medical decision making (see chart for details).   67 y/o male presents for multiple dog bite wounds of the left arm and left thigh that occurred today when his neighbors dog got off the leash and "attacked" him.  Complaint animal has been taken away by animal control.  The importance of monitoring As well.  Wounds have been cleaned.  Tetanus immunization updated today.  See images included in chart of patient's wounds.  Will treat prophylactically with Augmentin.  Discussed wound care.  He would like to hold off on rabies vaccination at this time and wait for more information from animal control.   Final Clinical Impressions(s) / UC Diagnoses   Final diagnoses:  Open wound of left lower leg, initial encounter  Open wound of left upper arm, initial encounter  Dog bite, initial encounter     Discharge Instructions      -Keep the wound clean and dry. - I sent antibiotic to the pharmacy to try to prevent infection but if you notice increased redness, swelling, pustular drainage or increased or  worsening pain you should be seen again and reevaluated for the possibility of infection. - Tetanus status updated today. - You would like to hold off on rabies vaccine for now wait for more information from animal control.  You may return to our department for rabies vaccination if desired.    ED Prescriptions     Medication Sig Dispense Auth. Provider   amoxicillin-clavulanate (AUGMENTIN) 875-125 MG tablet Take 1 tablet by mouth every 12 (twelve) hours for 7 days. 14 tablet Gareth Morgan      PDMP not reviewed this encounter.   Shirlee Latch, PA-C 06/29/22 1335

## 2022-06-29 NOTE — Discharge Instructions (Addendum)
-  Keep the wound clean and dry. - I sent antibiotic to the pharmacy to try to prevent infection but if you notice increased redness, swelling, pustular drainage or increased or worsening pain you should be seen again and reevaluated for the possibility of infection. - Tetanus status updated today. - You would like to hold off on rabies vaccine for now wait for more information from animal control.  You may return to our department for rabies vaccination if desired.

## 2022-06-29 NOTE — ED Triage Notes (Signed)
Patient with c/o dog bites to left arm and left thigh. Patient states it was the neighbor's dog and he is unsure of dog's vaccination status.

## 2023-07-20 ENCOUNTER — Other Ambulatory Visit: Payer: Self-pay | Admitting: Physician Assistant

## 2023-07-20 DIAGNOSIS — R42 Dizziness and giddiness: Secondary | ICD-10-CM

## 2023-07-20 DIAGNOSIS — R4189 Other symptoms and signs involving cognitive functions and awareness: Secondary | ICD-10-CM

## 2023-07-20 DIAGNOSIS — Z1331 Encounter for screening for depression: Secondary | ICD-10-CM

## 2023-07-20 DIAGNOSIS — R251 Tremor, unspecified: Secondary | ICD-10-CM

## 2023-07-28 ENCOUNTER — Ambulatory Visit
Admission: RE | Admit: 2023-07-28 | Discharge: 2023-07-28 | Disposition: A | Discharge status: Home / Self Care | Source: Ambulatory Visit | Attending: Physician Assistant | Admitting: Physician Assistant

## 2023-07-28 DIAGNOSIS — Z1331 Encounter for screening for depression: Secondary | ICD-10-CM | POA: Diagnosis present

## 2023-07-28 DIAGNOSIS — R4189 Other symptoms and signs involving cognitive functions and awareness: Secondary | ICD-10-CM | POA: Insufficient documentation

## 2023-07-28 DIAGNOSIS — R251 Tremor, unspecified: Secondary | ICD-10-CM | POA: Diagnosis present

## 2023-07-28 DIAGNOSIS — R42 Dizziness and giddiness: Secondary | ICD-10-CM | POA: Diagnosis present

## 2023-12-13 NOTE — Progress Notes (Signed)
 [facility] [address] [place], [place] [address]  Pulmonary Sleep Medicine   Office Visit Note  Patient Name: Daniel Odonnell DOB: 07-Jun-1955 MRN [id number]    Chief Complaint: Obstructive Sleep Apnea visit  Brief History:  Daniel Odonnell is seen today for an annual follow up on CPAP@ 15cmH20. The patient has a 17 year history of sleep apnea. Patient is using PAP nightly.  The patient feels not rested after sleeping with PAP.  The patient reports no benefit from PAP use. Reported sleepiness is not improved and the Epworth Sleepiness Score is 16 out of 24. The patient does take daily naps that last about 2-3 hours with CPAP. The patient complains of the following: EDS, brain fog and trouble focusing even with compliant CPAP usage. Patient has high leak with Airtouch F20 mask, will need to schedule mask fitting.  The compliance download shows 100% compliance with an average use time of 11 hours 11 minutes. The AHI is 6.2. He had a stoke about 4 years ago, was not aware of this initially until neurology found this on imaging after the fact. This led to significant depression and was on many meds. Now down to 2 medications with psychiatry. Tapering Prozac. Was seen by cardiology and had stress testing and echo. States HR elevated and started on metoprolol. He has heart monitoring currently to evaluate for SVT. Also sent to pulmonology and states this all looked good. No signs of COPD despite smoking. Patient states he can nap and never feels refreshed. Sleeps 9-10 hours and feels worse upon waking.  States pulmonology suggested provigil if ok with sleep and cardiology. Also seeing neurology, but was not addressing the fatigue, but rather the memory concerns.  ROS  General: (-) fever, (-) chills, (-) night sweat Nose and Sinuses: (-) nasal stuffiness or itchiness, (-) postnasal drip, (-) nosebleeds, (-) sinus trouble. Mouth and Throat: (-) sore throat, (-) hoarseness. Neck: (-) swollen  glands, (-) enlarged thyroid, (-) neck pain.   Current Medication: Outpatient Encounter Medications as of 12/14/2023  Medication Sig   AUVELITY 45-105 MG TBCR Take 1 tablet by mouth daily.   Cetirizine HCl (ZYRTEC PO) Take by mouth 2 (two) times daily.   Cholecalciferol 125 MCG (5000 UT) TABS Take by mouth.   diphenoxylate-atropine (LOMOTIL) 2.5-0.025 MG tablet Take by mouth 4 (four) times daily as needed for diarrhea or loose stools.   FLUoxetine (PROZAC) 40 MG capsule Take 80 mg by mouth daily.   Hyaluronic Acid (RESTYLANE IJ) Take by mouth once.    hydrochlorothiazide (MICROZIDE) 12.5 MG capsule Take 1 capsule by mouth daily.   Magnesium Oxide 250 MG TABS Take 400 mg by mouth every morning.    rosuvastatin (CRESTOR) 10 MG tablet Take 1 tablet by mouth daily.   traZODone (DESYREL) 50 MG tablet Take 100 mg by mouth at bedtime as needed.   [DISCONTINUED] fluticasone (FLONASE) 50 MCG/ACT nasal spray Place into both nostrils daily.   [DISCONTINUED] tadalafil (CIALIS) 5 MG tablet Take 5 mg by mouth daily as needed for erectile dysfunction.   No facility-administered encounter medications on file as of 12/14/2023.    Surgical History: Past Surgical History:  Procedure Laterality Date   COLONOSCOPY     COLONOSCOPY WITH PROPOFOL  N/A 03/20/2018   Procedure: COLONOSCOPY WITH PROPOFOL ;  Surgeon: Gaylyn Gladis PENNER, MD;  Location: [facility] ENDOSCOPY;  Service: Endoscopy;  Laterality: N/A;   HERNIA REPAIR     KNEE ARTHROSCOPY     KNEE SURGERY  Medical History: Past Medical History:  Diagnosis Date   Abnormal ECG 02/08/2012   Abnormal EKG    Allergy    Anxiety    Arthritis    Depression    Dizziness    Headache    History of myocardial perfusion scan    Hypertension    Insomnia    Normocytic anemia    Sleep apnea     Family History: Non contributory to the present illness  Social History: Social History   Socioeconomic History   Marital status: Married    Spouse name: Not  on file   Number of children: Not on file   Years of education: Not on file   Highest education level: Not on file  Occupational History   Not on file  Tobacco Use   Smoking status: Every Day    Current packs/day: 1.00    Types: Cigarettes   Smokeless tobacco: Never  Vaping Use   Vaping status: Never Used  Substance and Sexual Activity   Alcohol use: Not Currently   Drug use: Never   Sexual activity: Not Currently  Other Topics Concern   Not on file  Social History Narrative   Not on file   Social Drivers of Health   Financial Resource Strain: Low Risk  (03/17/2023)   Received from [facility]   Overall Financial Resource Strain (CARDIA)    Difficulty of Paying Living Expenses: Not hard at all  Food Insecurity: No Food Insecurity (03/17/2023)   Received from [facility]   Hunger Vital Sign    Within the past 12 months, you worried that your food would run out before you got the money to buy more.: Never true    Within the past 12 months, the food you bought just didn't last and you didn't have money to get more.: Never true  Transportation Needs: No Transportation Needs (03/17/2023)   Received from [facility] - Transportation    In the past 12 months, has lack of transportation kept you from medical appointments or from getting medications?: No    Lack of Transportation (Non-Medical): No  Physical Activity: Not on file  Stress: Not on file  Social Connections: Not on file  Intimate Partner Violence: Not on file    Vital Signs: There were no vitals taken for this visit. There is no height or weight on file to calculate BMI.    Examination: General Appearance: The patient is well-developed, well-nourished, and in no distress. Neck Circumference: 45cm Skin: Gross inspection of skin unremarkable. Head: normocephalic, no gross deformities. Eyes: no gross deformities noted. ENT: ears appear grossly  normal Neurologic: Alert and oriented. No involuntary movements.  STOP BANG RISK ASSESSMENT S (snore) Have you been told that you snore?     NO   T (tired) Are you often tired, fatigued, or sleepy during the day?   YES  O (obstruction) Do you stop breathing, choke, or gasp during sleep? NO   P (pressure) Do you have or are you being treated for high blood pressure? NO   B (BMI) Is your body index greater than 35 kg/m? NO   A (age) Are you 57 years old or older? YES   N (neck) Do you have a neck circumference greater than 16 inches?   YES   G (gender) Are you a male? YES   TOTAL STOP/BANG "YES" ANSWERS 4       A STOP-Bang score of  2 or less is considered low risk, and a score of 5 or more is high risk for having either moderate or severe [name]. For people who score 3 or 4, doctors may need to perform further assessment to determine how likely they are to have [name].         EPWORTH SLEEPINESS SCALE:  Scale:  (0)= no chance of dozing; (1)= slight chance of dozing; (2)= moderate chance of dozing; (3)= high chance of dozing  Chance  Situtation    Sitting and reading: 3    Watching TV: 2    Sitting Inactive in public: 2    As a passenger in car: 3      Lying down to rest: 3    Sitting and talking: 1    Sitting quielty after lunch: 2    In a car, stopped in traffic: 0   TOTAL SCORE:   16 out of 24    SLEEP STUDIES:  PSG (03/2005) AHI 56/hr, Sp02 88% Split (04/2005) AHI 23.75/hr,Supine AHI 39.44/hr, Min Sp02 90%, CPAP@ 11 cmH20 PSG (04/2019) AHI 33.3/hr, Supine AHI 50.3/hr, Min Sp02 82% Titration (05/2019) APAP@ 7-15 cmH20.   CPAP COMPLIANCE DATA:  Date Range: 11/23/2023 - 12/12/2023  Average Daily Use: 11 hours 11 minutes  Median Use: 11 hours 13 minutes  Compliance for > 4 Hours: 100% days  AHI: 6.2 respiratory events per hour  Days Used: 20/20  Mask Leak: 112.2  95th Percentile Pressure: 15 cmH20         LABS: No results found for this  or any previous visit (from the past 2160 hours).  Radiology: MR BRAIN WO CONTRAST Result Date: 08/09/2023 CLINICAL DATA:  Cough new 2 changes. Dizziness. Intermittent tremor. Depression. EXAM: MRI HEAD WITHOUT CONTRAST TECHNIQUE: Multiplanar, multiecho pulse sequences of the brain and surrounding structures were obtained without intravenous contrast. COMPARISON:  None Available. FINDINGS: Brain: Diffusion imaging does not show any acute or subacute infarction or other cause of restricted diffusion. There is an old small vessel infarction in right lateral pons. Old small vessel infarction in the left cerebellum. Old small vessel infarctions in the left thalamus. Old lacunar infarction in the right basal ganglia and in the left external capsule. Mild chronic small-vessel ischemic changes of the cerebral hemispheric white matter. No large vessel territory stroke. No mass, recent hemorrhage, hydrocephalus or extra-axial collection. Some hemosiderin deposition in the region of the old right basal ganglia stroke. Vascular: Major vessels at the base of the brain show flow. Skull and upper cervical spine: Negative Sinuses/Orbits: Small left maxillary sinus. No significant sinus inflammation. Orbits negative. Other: None IMPRESSION: No acute or reversible finding. Multiple old small vessel infarctions as outlined above. Mild chronic small-vessel ischemic changes of the cerebral hemispheric white matter. Electronically Signed   By: Oneil Officer M.D.   On: 08/09/2023 16:48    No results found.  No results found.    Assessment and Plan: Patient Active Problem List   Diagnosis Date Noted   Carpal joint sprain 03/29/2022   Chronic insomnia 03/29/2022   Depression 03/29/2022   Essential hypertension 03/29/2022   Gastroesophageal reflux disease 03/29/2022   History of hernia repair 03/29/2022   History of shoulder surgery 03/29/2022   Hyperlipidemia, mixed 10/20/2021   [name] on CPAP 11/19/2019   CPAP use  counseling 11/19/2019   Erectile dysfunction 11/15/2019   Elevated serum creatinine 09/23/2019   Hyperkalemia 09/23/2019   Fatigue 01/08/2013   Chest pain 03/02/2012   Coronary artery disease  involving native coronary artery of native heart without angina pectoris 02/08/2012   Anxiety 07/13/2011        1. [name] on CPAP (Primary) The patient does tolerate PAP and reports min benefit from PAP use. The patient was reminded how to adjust mask fit and advised to schedule mask fit appt. The patient was also counselled on nightly use. The compliance is excellent. The AHI is 6.2 with high leak. Will have mask fit and check 2 week download to see if improving. May consider adjusting to APAP if needed. May also benefit from updated testing given he has excessive sleepiness now and has had some health changes. Patient continues to require PAP to treat their apnea and is medically necessary.  2. CPAP use counseling CPAP couseling-Discussed importance of adequate CPAP use as well as proper care and cleaning techniques of machine and all supplies.  3. Excessive daytime sleepiness Dicussed previous stroke and depression both can contribute to excessive fatigue and symptom timelines correlate. Upon further review patient reports worsening fatigue around 4 years ago which is around time of stroke being identified. Also having workup for SVT which can increase fatigue. Discussed contraindication for provigil at this time while cardiac testing underway and would want clearance from cardiology before considering and to optimize cpap treatment first as well.  4. History of stroke Follow up with neurology and discuss worsening fatigue the past few years since stroke  5. Essential hypertension Continue current medication and f/u with PCP.  6. SVT (supraventricular tachycardia) Followed by cardiology  7. Obesity (BMI 30.0-34.9) Obesity Counseling: Had a lengthy discussion regarding patients BMI and weight  issues. Patient was instructed on portion control as well as increased activity. Also discussed caloric restrictions with trying to maintain intake less than 2000 Kcal. Discussions were made in accordance with the 5As of weight management. Simple actions such as not eating late and if able to, taking a walk is suggested.    General Counseling: I have discussed the findings of the evaluation and examination with Alm.  I have also discussed any further diagnostic evaluation thatmay be needed or ordered today. Alm verbalizes understanding of the findings of todays visit. We also reviewed his medications today and discussed drug interactions and side effects including but not limited excessive drowsiness and altered mental states. We also discussed that there is always a risk not just to him but also people around him. he has been encouraged to call the office with any questions or concerns that should arise related to todays visit.  No orders of the defined types were placed in this encounter.       I have personally obtained a history, examined the patient, evaluated laboratory and imaging results, formulated the assessment and plan and placed orders.  This patient was seen by Tinnie Pro, PA-C in collaboration with Dr. Elfreda Bathe as a part of collaborative care agreement.  Elfreda DELENA Bathe, MD [facility] Diplomate ABMS Pulmonary Critical Care Medicine and Sleep Medicine

## 2023-12-14 ENCOUNTER — Ambulatory Visit: Admitting: Internal Medicine

## 2023-12-14 VITALS — BP 123/82 | HR 73 | Resp 20 | Ht 73.0 in | Wt 242.4 lb

## 2023-12-14 DIAGNOSIS — G4719 Other hypersomnia: Secondary | ICD-10-CM

## 2023-12-14 DIAGNOSIS — G4733 Obstructive sleep apnea (adult) (pediatric): Secondary | ICD-10-CM

## 2023-12-14 DIAGNOSIS — Z8673 Personal history of transient ischemic attack (TIA), and cerebral infarction without residual deficits: Secondary | ICD-10-CM | POA: Diagnosis not present

## 2023-12-14 DIAGNOSIS — I10 Essential (primary) hypertension: Secondary | ICD-10-CM

## 2023-12-14 DIAGNOSIS — E66811 Obesity, class 1: Secondary | ICD-10-CM

## 2023-12-14 DIAGNOSIS — Z7189 Other specified counseling: Secondary | ICD-10-CM

## 2023-12-14 DIAGNOSIS — I471 Supraventricular tachycardia, unspecified: Secondary | ICD-10-CM

## 2023-12-14 NOTE — Patient Instructions (Signed)

## 2023-12-27 ENCOUNTER — Other Ambulatory Visit: Payer: Self-pay | Admitting: Physician Assistant

## 2023-12-27 DIAGNOSIS — G4719 Other hypersomnia: Secondary | ICD-10-CM

## 2023-12-27 DIAGNOSIS — G4733 Obstructive sleep apnea (adult) (pediatric): Secondary | ICD-10-CM

## 2023-12-27 MED ORDER — MODAFINIL 200 MG PO TABS: ORAL_TABLET | ORAL | 0 refills | Status: DC

## 2023-12-27 NOTE — Progress Notes (Signed)
 Patient called with continued excessive daytime sleepiness. CPAP pressures adjust last week and now shows well controlled, but still has sleepiness. Patient discussed trial of modafinil with cardiology and was told this would be fine from cardiac perspective. Patient advised on S/E and to monitor BP/HR with taking this medication.

## 2024-01-11 ENCOUNTER — Other Ambulatory Visit: Payer: Self-pay | Admitting: Physician Assistant

## 2024-01-11 DIAGNOSIS — G4719 Other hypersomnia: Secondary | ICD-10-CM

## 2024-01-11 DIAGNOSIS — G4733 Obstructive sleep apnea (adult) (pediatric): Secondary | ICD-10-CM

## 2024-01-11 MED ORDER — SUNOSI 75 MG PO TABS: ORAL_TABLET | ORAL | 0 refills | Status: DC

## 2024-01-11 NOTE — Progress Notes (Signed)
 Patient called reporting modafinil  ineffective. He self increased the dose to 2 tablets (400mg ) without any improvement. Will switch to sunosi , but pt advised to only take as prescribed and suggest he also follow up with his neurologist given level of fatigue during daytime despite medication and CPAP use.

## 2024-01-12 ENCOUNTER — Other Ambulatory Visit: Payer: Self-pay | Admitting: Cardiology

## 2024-01-12 DIAGNOSIS — I209 Angina pectoris, unspecified: Secondary | ICD-10-CM

## 2024-01-12 DIAGNOSIS — R5383 Other fatigue: Secondary | ICD-10-CM

## 2024-01-12 DIAGNOSIS — I493 Ventricular premature depolarization: Secondary | ICD-10-CM

## 2024-01-12 DIAGNOSIS — F17200 Nicotine dependence, unspecified, uncomplicated: Secondary | ICD-10-CM

## 2024-01-12 DIAGNOSIS — I10 Essential (primary) hypertension: Secondary | ICD-10-CM

## 2024-01-12 DIAGNOSIS — R0609 Other forms of dyspnea: Secondary | ICD-10-CM

## 2024-01-12 DIAGNOSIS — I4729 Other ventricular tachycardia: Secondary | ICD-10-CM

## 2024-01-12 DIAGNOSIS — I471 Supraventricular tachycardia, unspecified: Secondary | ICD-10-CM

## 2024-01-12 DIAGNOSIS — E782 Mixed hyperlipidemia: Secondary | ICD-10-CM

## 2024-02-13 NOTE — Progress Notes (Signed)
 " Follow-up   History of Present Illness: Daniel Odonnell is a 69 y.o. male who presents to clinic for recheck. [name], on cpap, increase hypersomnolence. Failed provigil , on sunosi  75 mg q day, still has hypersomnolence. Labs reviewed. Wearing cpap nightly. Suspect underlying depression. No chest pain, edema or calf pain. Still smoking. Advised not to. Om welbutrin. Being followed by sleep specialist.   Current Medications:  Current Outpatient Medications  Medication Sig Dispense Refill   ascorbic acid (VITAMIN C ORAL) Take 1 tablet by mouth once daily     FEVERFEW ORAL Take 300 mg by mouth once daily     FLUoxetine (PROZAC) 40 MG capsule Take 40 mg by mouth once daily     Herbal Supplement Take 15 mg by mouth once daily Herbal Name: Opti-Folate 5-MTHF     MAGNESIUM ORAL Take 500 mg by mouth 2 (two) times daily     metoprolol SUCCinate (TOPROL-XL) 50 MG XL tablet Take 1 tablet (50 mg total) by mouth once daily 30 tablet 11   MV-MN-IRON BIS-MTHFOLATE GLUC ORAL Take by mouth     NON FORMULARY Take by mouth Levomefolate Glucosamine (5-MTHF PO)     rosuvastatin (CRESTOR) 10 MG tablet TAKE 1 TABLET BY MOUTH EVERY DAY 90 tablet 1   SUNOSI  75 mg Tab Take 75 mg by mouth once daily     traZODone (DESYREL) 50 MG tablet 2 tablets (100 mg total) at bedtime     WELLBUTRIN SR 150 mg SR tablet Take 150 mg by mouth 2 (two) times daily     FLUoxetine 60 mg Take 40 mg by mouth 2 (two) times daily (Patient not taking: Reported on 02/13/2024)     NON FORMULARY UBIQUINOL (Patient not taking: Reported on 02/13/2024)     NON FORMULARY Take 88.5 mg by mouth once daily PQQ supplement (Patient not taking: Reported on 02/13/2024)     SAFFRON EXTRACT ORAL Take 100 mg by mouth once daily (Patient not taking: Reported on 02/13/2024)     No current facility-administered medications for this visit.    Problem List:  Patient Active Problem List  Diagnosis   Anxiety   Depression   Essential hypertension    Chronic insomnia   Other testicular hypofunction   Tobacco use disorder   Fatigue   [name] (obstructive sleep apnea)   Low testosterone   Headache(784.0)   Elevated serum creatinine   Erectile dysfunction   Bilateral carotid artery stenosis   Coronary artery disease involving native coronary artery of native heart without angina pectoris   Hyperlipidemia, mixed   [name] on CPAP   SOB (shortness of breath)    Allergies:  Patient has no known allergies.  History:  Past Medical History:  Diagnosis Date   Abnormal ECG 02/08/2012   01/27/12 - 'consider inferior infarction'   Abnormal EKG    Allergy 02/2014   unsure as to what is causing hives every day   Anxiety    Arthritis 30 yrs   osteoarthritis   Chest pain 03/02/2012   Depression    Dizziness    Frequent PVCs 10/20/2021   Headache(784.0)    Hx of myocardial perfusion scan 03/02/2012   02/17/2012 THA: Myocardial perfusion imaging is abnormal. Findings are suggestive of mild to moderate right coronary artery territory ischemia.  Artifacts noted: none.  Overall left ventricular systolic function is normal without regional wall motion abnormalities (see above).     Hyperlipidemia, mixed 10/20/2021   Hypertension    Insomnia, unspecified  Irregular heart beats 03/02/2012   Normocytic anemia 01/19/2018   Palpitations 09/04/2021   Sleep apnea 04/2004   uses bi-pap machine nightly   Tinnitus of both ears 03/29/2013    Past Surgical History:  Procedure Laterality Date   COLONOSCOPY  03/20/2018   Negative colon biopsies/Repeat 75yrs/MUS   COLONOSCOPY     HERNIA REPAIR     KNEE ARTHROSCOPY     KNEE SURGERY      Family History  Problem Relation Name Age of Onset   Lung cancer Mother kidney ca    Kidney cancer Mother kidney ca    Polycystic ovary syndrome Sister     Thyroid disease Sister     Bipolar disorder Sister     Bipolar disorder Sister [name]        Schizophrenia    Thyroid disease Sister [name]    Parkinsonism Sister [name]    Bipolar disorder Brother [name]        Bi-Polar   Diabetes Brother [name]    Stroke Other     Heart disease Other       reports that he has been smoking cigarettes. He started smoking about 13 years ago. He has a 13.8 pack-year smoking history. He has never used smokeless tobacco. He reports that he does not drink alcohol and does not use drugs.  Review of System: As per above. All systems were reviewed with him in totality. Again, no other cardiopulmonary symptoms. No nausea, vomiting, diarrhea, blood in stools,dysuria or flank pain. No GERD, dysphagia, choking spells, polyphagia, polydipsia, heat or cold intolerance, rashes, lymph nodes, bleeding, seizures, TIAs, passing out spells, suicidaly ideation, proximal muscle tenderness, joint effusions. ++ sleep apnea. On cpap. Dense hypersomnolence  Physical Exam: BP 131/79   Pulse 69   Wt (!) 111.1 kg (245 lb)   SpO2 98% Comment: room air  BMI 32.32 kg/m  (!) 111.1 kg (245 lb) 98% General:  NAD. Able to speak in complete sentences without cough or dyspnea HEENT: Normocephalic, nontraumatic. Extraocular movements intact NECK: Supple. No JVD, nodes, thryomegaly CV: RRR no murmurs, gallops, rubs PULM: Normal respiratory effort, Clear to auscultation bilaterally without wheezing or crackles EXTREMITIES: No significant edema, cyanosis or Homans'signs SKIN: Fair turgor. No rashes LYMPHATIC: No nodes NEURO: No gross deficits PSYCH: Appropriate affect, alert, oriented   Diagnostics: SPIROMETRY: FVC was 5.51 L, 114 % of predicted FEV1 was 4.13 L, 113 % of predicted FEV1/FVC ratio was  99 % of predicted FEF 25-75% liters per second was 100 % of predicted   LUNG VOLUMES: TLC was 100 % of predicted RV was 83 % of predicted   DIFFUSION CAPACITY: DLCO was 78 % of predicted DLCO/VA was 66 % of predicted     Good patient effort with good  repeatability.   Interpretation: Spirometry and lung volumes are super normal DLCO is slightly decreased   Interpreting Physician DR. Fleming   Echo NORMAL LA PRESSURES WITH NORMAL DIASTOLIC FUNCTION VALVULAR REGURGITATION: TRIVIAL AR, MILD MR, TRIVIAL PR, MILD TR ESTIMATED RVSP: 28 mmHg (Normal) NO VALVULAR STENOSIS   IMPRESSION: MRI No acute or reversible finding. Multiple old small vessel  infarctions as outlined above. Mild chronic small-vessel ischemic  changes of the cerebral hemispheric white matter.    Electronically Signed    By: Oneil Officer M.D.    On: 08/09/2023 16:48      XR CHEST PA AND LATERAL    INDICATION: J20.8 Acute bronchitis due to other specified  organisms, B96.89 Other specified bacterial agents as the cause of diseases classified elsewhere COMPARISON: None.   FINDINGS/IMPRESSION: 1.  Normal heart size. Tortuous thoracic aorta. 2.  Diffuse bronchial wall thickening as can be seen in the setting of bronchitis. No focal consolidation. 3.  No pneumothorax or pleural effusion.      Electronically Signed by:  Silvano Borer, MD, [facility] Electronically Signed on:  05/19/2023 10:17 AM     Thyroid Stimulating Hormone (TSH) 1.69 1.54 <redacted file path> 1.69 <redacted file path> 2.22 <redacted file path> 2.60 <redacted file path> 2.08 <redacted file path> 1.77 <redacted file path>     Thyroxine, Free (FT4) 0.93 0.92 <redacted file path> 0.87 <redacted file path>   0.84 <redacted file path> 0.98 <redacted file path> 0.95 <redacted file path>    K 4.2   Hemoglobin 15.6 14.9 <redacted file path> 15.2 <redacted file path> 15.5 <redacted file path> 16.1 <redacted file path> 15.8 <redacted file path> 14.0 <redacted file path>     Hematocrit 44.3 42.3 <redacted file path> 43.6 <redacted file path> 43.6 <redacted file path> 46.4 <redacted file path> 46.3 <redacted file path> 39.7 <redacted file path>    Impression/ Plan: Smoker, PFTS are in normal  range. Copd, stage 0 -stop smoker -prn albuterol 2 puff qid   -limit caffeine -nicotine patches -f/u 3 months   Sleep apnea on auto cpap, nightly -continue same settings -sleep specialist following   Hypersomnolence ( osa, s/p stroke, depression, ? Meds), failed provigil , on sunosi  75 mg not much benefit, no anemia. Thyroid normal, vit d normal -start 150 mg q of sunosi  and see -limit sedating meds    "

## 2024-02-14 ENCOUNTER — Encounter (HOSPITAL_COMMUNITY): Payer: Self-pay

## 2024-02-15 ENCOUNTER — Other Ambulatory Visit: Payer: Self-pay | Admitting: Physician Assistant

## 2024-02-15 DIAGNOSIS — G4719 Other hypersomnia: Secondary | ICD-10-CM

## 2024-02-15 DIAGNOSIS — G4733 Obstructive sleep apnea (adult) (pediatric): Secondary | ICD-10-CM

## 2024-02-15 MED ORDER — SUNOSI 150 MG PO TABS: 150.0000 mg | ORAL_TABLET | Freq: Every day | ORAL | 0 refills | Status: DC

## 2024-02-16 ENCOUNTER — Ambulatory Visit
Admission: RE | Admit: 2024-02-16 | Discharge: 2024-02-16 | Disposition: A | Discharge status: Home / Self Care | Source: Ambulatory Visit | Attending: Cardiology | Admitting: Cardiology

## 2024-02-16 DIAGNOSIS — F17200 Nicotine dependence, unspecified, uncomplicated: Secondary | ICD-10-CM | POA: Insufficient documentation

## 2024-02-16 DIAGNOSIS — I471 Supraventricular tachycardia, unspecified: Secondary | ICD-10-CM | POA: Insufficient documentation

## 2024-02-16 DIAGNOSIS — E782 Mixed hyperlipidemia: Secondary | ICD-10-CM | POA: Diagnosis present

## 2024-02-16 DIAGNOSIS — R0609 Other forms of dyspnea: Secondary | ICD-10-CM | POA: Diagnosis present

## 2024-02-16 DIAGNOSIS — I10 Essential (primary) hypertension: Secondary | ICD-10-CM | POA: Insufficient documentation

## 2024-02-16 DIAGNOSIS — R5383 Other fatigue: Secondary | ICD-10-CM | POA: Diagnosis present

## 2024-02-16 DIAGNOSIS — I4729 Other ventricular tachycardia: Secondary | ICD-10-CM | POA: Diagnosis present

## 2024-02-16 DIAGNOSIS — I209 Angina pectoris, unspecified: Secondary | ICD-10-CM | POA: Diagnosis present

## 2024-02-16 DIAGNOSIS — I493 Ventricular premature depolarization: Secondary | ICD-10-CM | POA: Diagnosis present

## 2024-02-16 MED ORDER — NITROGLYCERIN 0.4 MG SL SUBL
0.8000 mg | SUBLINGUAL_TABLET | Freq: Once | SUBLINGUAL | Status: AC
  Administered 2024-02-16: 0.8 mg via SUBLINGUAL
  Filled 2024-02-16: qty 25

## 2024-02-16 MED ORDER — METOPROLOL TARTRATE 5 MG/5ML IV SOLN
10.0000 mg | Freq: Once | INTRAVENOUS | Status: DC | PRN
  Filled 2024-02-16: qty 10

## 2024-02-16 MED ORDER — IOHEXOL 350 MG/ML SOLN
100.0000 mL | Freq: Once | INTRAVENOUS | Status: AC | PRN
  Administered 2024-02-16: 100 mL via INTRAVENOUS

## 2024-02-16 MED ORDER — NITROGLYCERIN 0.4 MG SL SUBL
SUBLINGUAL_TABLET | SUBLINGUAL | Status: AC
  Filled 2024-02-16: qty 2

## 2024-02-16 MED ORDER — DILTIAZEM HCL 25 MG/5ML IV SOLN
10.0000 mg | INTRAVENOUS | Status: DC | PRN
  Filled 2024-02-16: qty 5

## 2024-02-16 NOTE — Progress Notes (Signed)
 Patient tolerated procedure well. W/C to lobby.  Ambulate w/o difficulty. Denies light headedness or being dizzy. Encouraged to drink extra water today and reasoning explained. Verbalized understanding. All questions answered. ABC intact. No further needs. Discharge from procedure area w/o issues.

## 2024-02-17 ENCOUNTER — Other Ambulatory Visit: Payer: Self-pay | Admitting: Physician Assistant

## 2024-02-17 DIAGNOSIS — G4733 Obstructive sleep apnea (adult) (pediatric): Secondary | ICD-10-CM

## 2024-02-17 DIAGNOSIS — G4719 Other hypersomnia: Secondary | ICD-10-CM

## 2024-02-17 MED ORDER — ARMODAFINIL 150 MG PO TABS: 150.0000 mg | ORAL_TABLET | Freq: Every day | ORAL | 0 refills | Status: AC

## 2024-02-20 ENCOUNTER — Telehealth: Payer: Self-pay

## 2024-02-20 NOTE — Telephone Encounter (Addendum)
 Completed P.A. for patient's Armodafinil . Pt approved.

## 2024-03-13 ENCOUNTER — Ambulatory Visit: Attending: Internal Medicine
